# Patient Record
Sex: Male | Born: 1960 | Race: White | Hispanic: No | Marital: Married | Smoking: Never smoker
Health system: Southern US, Community
[De-identification: ages and names within clinical notes are randomized; demographics above are authoritative.]

## PROBLEM LIST (undated history)

## (undated) DIAGNOSIS — M109 Gout, unspecified: Secondary | ICD-10-CM

## (undated) DIAGNOSIS — M1990 Unspecified osteoarthritis, unspecified site: Secondary | ICD-10-CM

## (undated) HISTORY — PX: NO PAST SURGERIES: SHX2092

## (undated) HISTORY — DX: Unspecified osteoarthritis, unspecified site: M19.90

---

## 2010-06-25 DEATH — deceased

## 2016-09-09 ENCOUNTER — Ambulatory Visit
Admission: EM | Admit: 2016-09-09 | Discharge: 2016-09-09 | Disposition: A | Discharge status: Home / Self Care | Payer: BLUE CROSS/BLUE SHIELD | Attending: Family Medicine | Admitting: Family Medicine

## 2016-09-09 ENCOUNTER — Ambulatory Visit (INDEPENDENT_AMBULATORY_CARE_PROVIDER_SITE_OTHER): Payer: BLUE CROSS/BLUE SHIELD

## 2016-09-09 DIAGNOSIS — S39012A Strain of muscle, fascia and tendon of lower back, initial encounter: Secondary | ICD-10-CM

## 2016-09-09 DIAGNOSIS — M5136 Other intervertebral disc degeneration, lumbar region: Secondary | ICD-10-CM

## 2016-09-09 HISTORY — DX: Gout, unspecified: M10.9

## 2016-09-09 LAB — URINALYSIS, COMPLETE (UACMP) WITH MICROSCOPIC
BACTERIA UA: NONE SEEN
Bilirubin Urine: NEGATIVE
GLUCOSE, UA: NEGATIVE mg/dL
Hgb urine dipstick: NEGATIVE
Ketones, ur: NEGATIVE mg/dL
Leukocytes, UA: NEGATIVE
Nitrite: NEGATIVE
PROTEIN: NEGATIVE mg/dL
Squamous Epithelial / LPF: NONE SEEN
pH: 6 (ref 5.0–8.0)

## 2016-09-09 MED ORDER — KETOROLAC TROMETHAMINE 60 MG/2ML IM SOLN
60.0000 mg | Freq: Once | INTRAMUSCULAR | Status: AC
  Administered 2016-09-09: 60 mg via INTRAMUSCULAR

## 2016-09-09 MED ORDER — METAXALONE 800 MG PO TABS: 800.0000 mg | ORAL_TABLET | Freq: Three times a day (TID) | ORAL | 0 refills | Status: DC

## 2016-09-09 MED ORDER — NAPROXEN 500 MG PO TABS: 500.0000 mg | ORAL_TABLET | Freq: Two times a day (BID) | ORAL | 0 refills | Status: DC

## 2016-09-09 NOTE — ED Provider Notes (Signed)
CSN: [id number]     Arrival date & time 09/09/16  1123 History   First MD Initiated Contact with Patient 09/09/16 1227     Chief Complaint  Patient presents with  . Back Pain   (Consider location/radiation/quality/duration/timing/severity/associated sxs/prior Treatment) HPI  This 56 year old male who presents with right lower back pain that is nonradiating. 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 He states his pain is worse at nighttime. He also states that he has had  many back injuries in the past and has seen a chiropractor on numerous occasions but this feels different to him. No Radiation of pain into his lower extremities.     Past Medical History:  Diagnosis Date  . Gout    Past Surgical History:  Procedure Laterality Date  . NO PAST SURGERIES     Family History  Problem Relation Age of Onset  . Cancer Mother   . Cancer Father    Social History  Substance Use Topics  . Smoking status: Never Smoker  . Smokeless tobacco: Never Used  . Alcohol use No    Review of Systems  Allergies  Patient has no known allergies.  Home Medications   Prior to Admission medications   Medication Sig Start Date End Date Taking? Authorizing Provider  indomethacin (INDOCIN) 50 MG capsule Take 50 mg by mouth 2 (two) times daily with a meal.   Yes Historical Provider, MD  meloxicam (MOBIC) 15 MG tablet Take 15 mg by mouth daily.   Yes Historical Provider, MD  metaxalone (SKELAXIN) 800 MG tablet Take 1 tablet (800 mg total) by mouth 3 (three) times daily. 09/09/16   Lutricia FeilWilliam P Branae Crail, PA-C  naproxen (NAPROSYN) 500 MG tablet Take 1 tablet (500 mg total) by mouth 2 (two) times daily with a meal. 09/09/16   Lutricia FeilWilliam P Keyshia Orwick, PA-C   Meds Ordered and Administered this Visit   Medications  ketorolac (TORADOL) injection 60 mg (60 mg Intramuscular Given 09/09/16 1340)     BP 125/85 (BP Location: Left Arm)   Pulse 70   Temp 98.2 F (36.8 C) (Oral)   Resp 18   Ht 6' (1.829 m)   Wt 250 lb (113.4 kg)   SpO2 98%   BMI 33.91 kg/m  No data found.   Physical Exam  Constitutional: He is oriented to person, place, and time. He appears well-developed and well-nourished. No distress.  HENT:  Head: Normocephalic and atraumatic.  Eyes: Pupils are equal, round, and reactive to light. Right eye exhibits no discharge. Left eye exhibits no discharge.  Neck: Normal range of motion. Neck supple.  Musculoskeletal: He exhibits tenderness.  Examination of the lumbar spine is a level pelvis in stance. Patient is able to forward flex with his fingers to the level of his ankles. Turning to upright posture does not cause him pain. Lateral flexion to the right shows a normal curve to the lumbar spine. Lateral flexion to the left shows blunting of the spine with Complaints of discomfort at the L3-L5 levels in the paraspinous muscles. Rotation does not cause any discomfort. Patient is able to toe and heel walk adequately. EHL peroneal and anterior tibialis muscles are strong to clinical testing. And sensation distally is intact to light touch throughout. L our testing in the seated position at 90 is negative bilaterally. DTRs are 1-2+ over 4 bilaterally symmetrical. There is  no clonus present.  Neurological: He is alert and oriented to person, place, and time. He displays normal reflexes. No sensory deficit. He exhibits normal muscle tone.  Skin: Skin is warm and dry. He is not diaphoretic.  Psychiatric: He has a normal mood and affect. His behavior is normal. Judgment and thought content normal.  Nursing note and vitals reviewed.   Urgent Care Course     Procedures (including critical care time)  Labs Review Labs Reviewed  URINALYSIS, COMPLETE (UACMP) WITH MICROSCOPIC - Abnormal; Notable for the following:       Result Value   Specific Gravity, Urine <1.005 (*)    All  other components within normal limits    Imaging Review Dg Lumbar Spine Complete  Result Date: 09/09/2016 CLINICAL DATA:  56 year old male with history of right-sided lower back pain for the past 3 days. No history of injury. EXAM: LUMBAR SPINE - COMPLETE 4+ VIEW COMPARISON:  No priors. FINDINGS: No acute displaced fracture or compression type fracture identified in the lumbar spine. Multilevel degenerative disc disease, most evident at T12-L1, L4-L5 and L5-S1. Multilevel facet arthropathy, most severe at L4-L5 and L5-S1. No defects of the pars interarticularis are noted. Alignment is anatomic. IMPRESSION: 1. No acute radiographic abnormality of the lumbar spine. 2. Multilevel degenerative disc disease and lumbar spondylosis, as above. Electronically Signed   By: Trudie Reed M.D.   On: 09/09/2016 13:33     Visual Acuity Review  Right Eye Distance:   Left Eye Distance:   Bilateral Distance:    Right Eye Near:   Left Eye Near:    Bilateral Near:     Medications  ketorolac (TORADOL) injection 60 mg (60 mg Intramuscular Given 09/09/16 1340)      MDM   1. Strain of lumbar region, initial encounter   2. DDD (degenerative disc disease), lumbar    New Prescriptions   METAXALONE (SKELAXIN) 800 MG TABLET    Take 1 tablet (800 mg total) by mouth 3 (three) times daily.   NAPROXEN (NAPROSYN) 500 MG TABLET    Take 1 tablet (500 mg total) by mouth 2 (two) times daily with a meal.  Plan: 1. Test/x-ray results and diagnosis reviewed with patient 2. rx as per orders; risks, benefits, potential side effects reviewed with patient 3. Recommend supportive treatment with Rest and symptom avoidance. Avoid sitting lifting and bending. I've cautioned him regarding using the muscle relaxers with activities requiring concentration are judgment and not to drive for taking the medication. He may follow-up with his chiropractor as necessary 4. F/u prn if symptoms worsen or don't improve     Lutricia Feil, PA-C 09/09/16 1417

## 2016-09-09 NOTE — ED Triage Notes (Signed)
Patient complains of right lower back pain. Patient states that he has noticed darker urine and has been taking cranberry. Patient reports that pain is worse at night.

## 2017-06-07 ENCOUNTER — Ambulatory Visit
Admission: RE | Admit: 2017-06-07 | Discharge: 2017-06-07 | Disposition: A | Discharge status: Home / Self Care | Payer: BLUE CROSS/BLUE SHIELD | Source: Ambulatory Visit | Attending: Internal Medicine | Admitting: Internal Medicine

## 2017-06-07 DIAGNOSIS — Z01810 Encounter for preprocedural cardiovascular examination: Secondary | ICD-10-CM | POA: Diagnosis not present

## 2017-06-13 ENCOUNTER — Encounter: Payer: Self-pay | Admitting: Internal Medicine

## 2017-06-13 ENCOUNTER — Ambulatory Visit: Payer: BLUE CROSS/BLUE SHIELD | Admitting: Internal Medicine

## 2017-06-13 VITALS — BP 138/98 | HR 64 | Ht 72.0 in | Wt 246.2 lb

## 2017-06-13 DIAGNOSIS — Z01810 Encounter for preprocedural cardiovascular examination: Secondary | ICD-10-CM | POA: Diagnosis not present

## 2017-06-13 DIAGNOSIS — R030 Elevated blood-pressure reading, without diagnosis of hypertension: Secondary | ICD-10-CM

## 2017-06-13 NOTE — Patient Instructions (Signed)
Medication Instructions:  Your physician recommends that you continue on your current medications as directed. Please refer to the Current Medication list given to you today.   Labwork: none  Testing/Procedures: none  Follow-Up: Your physician recommends that you schedule a follow-up appointment in: AS NEEDED.  

## 2017-06-13 NOTE — Progress Notes (Signed)
New Outpatient Visit Date: 06/13/2017  Referring Provider: Jaclyn Newman, Cody C, MD [address]   [place], [place] [address]  Chief Complaint: Preoperative cardiovascular evaluation  HPI:  [name] is a 56 y.o. male who is being seen today for the evaluation of preoperative cardiovascular risk at the request of CodyTate. He has a history of bilateral knee osteoarthritis with significant pain that is affecting his ability to ambulate (right greater than left).  He is being evaluated for a total knee replacement by Dr. Martha Newman next month. He reports progressive pain in the right knee, which is now present with minimal to no activity. He had transient improvement with a knee injection earlier this year, though the relief lasted only about 3 months.  [name] has otherwise felt well. 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 He often has to climb quite a bit as part of his job, and does not have any symptoms including chest pain and shortness of breath. He also denies palpitations, lightheadedness, orthopnea, PND, and edema. He he denies a history of prior cardiac diease and testing. He has never had surgery before.  [name] notes intermittent gout flairs, for which he uses a variety of NSAIDs (he also uses them for his arthritis). --------------------------------------------------------------------------------------------------  Cardiovascular History & Procedures: Cardiovascular Problems:  None  Risk Factors:  Male gender, obesity, and age >= 7255  Cath/PCI:  None  CV Surgery:  None  EP Procedures and Devices:  None  Non-Invasive Evaluation(s):  None  --------------------------------------------------------------------------------------------------  Past Medical History:  Diagnosis Date  . Gout   . Osteoarthritis     Past Surgical History:  Procedure Laterality Date  . NO PAST SURGERIES      Current Meds    Medication Sig  . indomethacin (INDOCIN) 50 MG capsule Take 50 mg by mouth 2 (two) times daily with a meal.  . meloxicam (MOBIC) 15 MG tablet Take 15 mg by mouth daily.  . traMADol (ULTRAM) 50 MG tablet Take 50 mg by mouth as needed.     Allergies: Patient has no known allergies.  Social History   Socioeconomic History  . Marital status: Married    Spouse name: Not on file  . Number of children: Not on file  . Years of education: Not on file  . Highest education level: Not on file  Social Needs  . Financial resource strain: Not on file  . Food insecurity - worry: Not on file  . Food insecurity - inability: Not on file  . Transportation needs - medical: Not on file  . Transportation needs - non-medical: Not on file  Occupational History  . Not on file  Tobacco Use  . Smoking status: Never Smoker  . Smokeless tobacco: Never Used  Substance and Sexual Activity  . Alcohol use: Yes    Comment: Rare EtoH; tries to avoid due to gout.  . Drug use: No  . Sexual activity: Not on file  Other Topics Concern  . Not on file  Social History Narrative  . Not on file    Family History  Problem Relation Age of Onset  . Cancer Mother        Breast  . Cancer Father   . Alcohol abuse Father   . Heart disease Neg Hx     Review of Systems: A 12-system review of systems was performed and was negative except as noted in the HPI.  --------------------------------------------------------------------------------------------------  Physical Exam:  BP (!) 138/98 (BP Location: Right Arm, Patient Position: Sitting, Cuff Size: Normal)   Pulse 64   Ht 6' (1.829 m)   Wt 246 lb 4 oz (111.7 kg)   BMI 33.40 kg/m   General:  Obese man, seated comfortably in the exam room. HEENT: No conjunctival pallor or scleral icterus. Moist mucous membranes. OP clear. Neck: Supple without lymphadenopathy, thyromegaly, JVD, or HJR. No carotid bruit. Lungs: Normal work of breathing. Clear to auscultation  bilaterally without wheezes or crackles. Heart: Regular rate and rhythm without murmurs, rubs, or gallops. Non-displaced PMI. Abd: Bowel sounds present. Soft, NT/ND. Unable to assess HSM due to body habitus. Ext: No lower extremity edema. Radial, PT, and DP pulses are 2+ bilaterally Skin: Warm and dry without rash. Neuro: CNIII-XII intact. Strength and fine-touch sensation intact in upper and lower extremities bilaterally. Psych: Normal mood and affect.  EKG:  NSR without abnormalities.  I personally observed the patient climb 2 flights of steps without angina or shortness of breath.  No results found for: WBC, HGB, HCT, MCV, PLT  No results found for: NA, K, CL, CO2, BUN, CREATININE, GLUCOSE, ALT  No results found for: CHOL, HDL, LDLCALC, LDLDIRECT, TRIG, CHOLHDL   --------------------------------------------------------------------------------------------------  ASSESSMENT AND PLAN: Preoperative cardiovascular risk assessment [name] is planning to undergo right total knee arthrLoleta Chanceoplasty next month. He does not have a history of cardiac disease nor does he endorse any symptoms of unstable cardiovascular disease. His EKG today is normal. [name] is able to perform at least 4 METS of activity without symptoms. He is low risk for perioperative cardiovascular complications and can proceed as scheduled from a cardiac standpoint. No further cardiac testing is warranted prior to surgery.   Elevated blood pressure Blood pressure is mildly elevated, which may be influenced by pain (arthritis and ongoing gout flair) as well as NSAID use. I encouraged [name] to limit his salt intake and follow-up with Dr. Arlana Newman to ensure that his blood pressure is not persistently elevated.  Follow-up: Return to clinic as needed.  Cody Kendallhristopher Jahzier Villalon, MD 06/14/2017 11:34 AM

## 2017-06-14 ENCOUNTER — Encounter: Payer: Self-pay | Admitting: Internal Medicine

## 2017-06-14 DIAGNOSIS — Z01810 Encounter for preprocedural cardiovascular examination: Secondary | ICD-10-CM | POA: Insufficient documentation

## 2017-06-14 DIAGNOSIS — R030 Elevated blood-pressure reading, without diagnosis of hypertension: Secondary | ICD-10-CM | POA: Insufficient documentation

## 2017-06-28 ENCOUNTER — Other Ambulatory Visit: Payer: Self-pay | Admitting: Orthopedic Surgery

## 2017-07-03 ENCOUNTER — Ambulatory Visit
Admission: RE | Admit: 2017-07-03 | Discharge: 2017-07-03 | Disposition: A | Discharge status: Home / Self Care | Payer: BLUE CROSS/BLUE SHIELD | Source: Ambulatory Visit | Attending: Orthopedic Surgery | Admitting: Orthopedic Surgery

## 2017-07-03 ENCOUNTER — Encounter
Admission: RE | Admit: 2017-07-03 | Discharge: 2017-07-03 | Disposition: A | Discharge status: Home / Self Care | Payer: BLUE CROSS/BLUE SHIELD | Source: Ambulatory Visit | Attending: Orthopedic Surgery | Admitting: Orthopedic Surgery

## 2017-07-03 ENCOUNTER — Other Ambulatory Visit: Payer: Self-pay

## 2017-07-03 DIAGNOSIS — Z01818 Encounter for other preprocedural examination: Secondary | ICD-10-CM | POA: Insufficient documentation

## 2017-07-03 DIAGNOSIS — Z01811 Encounter for preprocedural respiratory examination: Secondary | ICD-10-CM | POA: Insufficient documentation

## 2017-07-03 LAB — CBC WITH DIFFERENTIAL/PLATELET
BASOS ABS: 0.1 10*3/uL (ref 0–0.1)
BASOS PCT: 1 %
Eosinophils Absolute: 0.1 10*3/uL (ref 0–0.7)
Eosinophils Relative: 1 %
HEMATOCRIT: 43.4 % (ref 40.0–52.0)
HEMOGLOBIN: 14.6 g/dL (ref 13.0–18.0)
Lymphocytes Relative: 28 %
Lymphs Abs: 1.7 10*3/uL (ref 1.0–3.6)
MCH: 30.3 pg (ref 26.0–34.0)
MCHC: 33.7 g/dL (ref 32.0–36.0)
MCV: 90 fL (ref 80.0–100.0)
MONO ABS: 0.7 10*3/uL (ref 0.2–1.0)
Monocytes Relative: 11 %
NEUTROS ABS: 3.6 10*3/uL (ref 1.4–6.5)
NEUTROS PCT: 59 %
Platelets: 287 10*3/uL (ref 150–440)
RBC: 4.82 MIL/uL (ref 4.40–5.90)
RDW: 13.2 % (ref 11.5–14.5)
WBC: 6.1 10*3/uL (ref 3.8–10.6)

## 2017-07-03 LAB — URINALYSIS, ROUTINE W REFLEX MICROSCOPIC
BILIRUBIN URINE: NEGATIVE
Glucose, UA: NEGATIVE mg/dL
Hgb urine dipstick: NEGATIVE
KETONES UR: NEGATIVE mg/dL
Leukocytes, UA: NEGATIVE
NITRITE: NEGATIVE
PROTEIN: NEGATIVE mg/dL
SPECIFIC GRAVITY, URINE: 1.006 (ref 1.005–1.030)
pH: 6 (ref 5.0–8.0)

## 2017-07-03 LAB — TYPE AND SCREEN
ABO/RH(D): O POS
Antibody Screen: NEGATIVE

## 2017-07-03 LAB — COMPREHENSIVE METABOLIC PANEL
ALBUMIN: 3.9 g/dL (ref 3.5–5.0)
ALT: 24 U/L (ref 17–63)
AST: 22 U/L (ref 15–41)
Alkaline Phosphatase: 67 U/L (ref 38–126)
Anion gap: 7 (ref 5–15)
BILIRUBIN TOTAL: 1.3 mg/dL — AB (ref 0.3–1.2)
BUN: 12 mg/dL (ref 6–20)
CO2: 27 mmol/L (ref 22–32)
Calcium: 9.2 mg/dL (ref 8.9–10.3)
Chloride: 102 mmol/L (ref 101–111)
Creatinine, Ser: 0.68 mg/dL (ref 0.61–1.24)
GFR calc Af Amer: >60 mL/min (ref >60)
GFR calc non Af Amer: >60 mL/min (ref >60)
GLUCOSE: 88 mg/dL (ref 65–99)
Potassium: 3.8 mmol/L (ref 3.5–5.1)
Sodium: 136 mmol/L (ref 135–145)
TOTAL PROTEIN: 7.5 g/dL (ref 6.5–8.1)

## 2017-07-03 LAB — APTT: APTT: 29 s (ref 24–36)

## 2017-07-03 LAB — HEMOGLOBIN A1C
Hgb A1c MFr Bld: 5.3 % (ref 4.8–5.6)
Mean Plasma Glucose: 105.41 mg/dL

## 2017-07-03 NOTE — Patient Instructions (Signed)
Your procedure is scheduled on:  Thursday, July 11, 2017 Report to Same Day Surgery on the 2nd floor in the [facility]. To find out your arrival time, please call [phone] between 1PM - 3PM on: Wednesday, July 10, 2017  REMEMBER: Instructions that are not followed completely may result in serious medical risk, up to and including death; or upon the discretion of your surgeon and anesthesiologist your surgery may need to be rescheduled.  Do not eat food after midnight the night before your procedure.  No gum chewing or hard candies.  You may however, drink CLEAR liquids up to 2 hours before you are scheduled to arrive at the hospital for your procedure.  Do not drink clear liquids within 2 hours of the start of your surgery.  Clear liquids include: - water  - apple juice without pulp - clear gatorade - black coffee or tea (Do NOT add anything to the coffee or tea) Do NOT drink anything that is not on this list.  No Alcohol for 24 hours before or after surgery.  No Smoking including e-cigarettes for 24 hours prior to surgery. No chewable tobacco products for at least 6 hours prior to surgery. No nicotine patches on the day of surgery.  Notify your doctor if there is any change in your medical condition (cold, fever, infection).  Do not wear jewelry, make-up, hairpins, clips or nail polish.  Do not wear lotions, powders, or perfumes. You may wear deodorant.  Do not shave 48 hours prior to surgery. Men may shave face and neck.  Contacts and dentures may not be worn into surgery.  Do not bring valuables to the hospital. [facility] is not responsible for any belongings or valuables.  TAKE THESE MEDICATIONS THE MORNING OF SURGERY:  none  Use CHG Soap as directed on instruction sheet.  NOW!  Stop Anti-inflammatories such as INDOMETHACIN, MELOXICAM, Advil, Aleve, Ibuprofen, Motrin, Naproxen, Naprosyn, Goodie powder, or aspirin products. (May take TRAMADOL, Tylenol or  Acetaminophen if needed.)  NOW!  Stop ANY OVER THE COUNTER supplements until after surgery.  If you are being admitted to the hospital overnight, leave your suitcase in the car. After surgery it may be brought to your room.  Please call the number above if you have any questions about these instructions.

## 2017-07-04 LAB — SURGICAL PCR SCREEN
MRSA, PCR: NEGATIVE
Staphylococcus aureus: NEGATIVE

## 2017-07-10 MED ORDER — CLINDAMYCIN PHOSPHATE 600 MG/50ML IV SOLN
600.0000 mg | Freq: Once | INTRAVENOUS | Status: AC
  Administered 2017-07-11: 600 mg via INTRAVENOUS

## 2017-07-10 MED ORDER — CEFAZOLIN SODIUM-DEXTROSE 2-4 GM/100ML-% IV SOLN
2.0000 g | INTRAVENOUS | Status: AC
  Administered 2017-07-11: 2 g via INTRAVENOUS

## 2017-07-11 ENCOUNTER — Encounter
Admission: RE | Disposition: A | Discharge status: Home / Self Care | Payer: Self-pay | Source: Ambulatory Visit | Attending: Orthopedic Surgery

## 2017-07-11 ENCOUNTER — Inpatient Hospital Stay: Payer: BLUE CROSS/BLUE SHIELD | Admitting: Anesthesiology

## 2017-07-11 ENCOUNTER — Other Ambulatory Visit: Payer: Self-pay

## 2017-07-11 ENCOUNTER — Inpatient Hospital Stay: Payer: BLUE CROSS/BLUE SHIELD

## 2017-07-11 ENCOUNTER — Inpatient Hospital Stay
Admission: RE | Admit: 2017-07-11 | Discharge: 2017-07-14 | DRG: 470 | Disposition: A | Discharge status: Home / Self Care | Payer: BLUE CROSS/BLUE SHIELD | Source: Ambulatory Visit | Attending: Orthopedic Surgery | Admitting: Orthopedic Surgery

## 2017-07-11 DIAGNOSIS — Z6833 Body mass index (BMI) 33.0-33.9, adult: Secondary | ICD-10-CM

## 2017-07-11 DIAGNOSIS — M170 Bilateral primary osteoarthritis of knee: Principal | ICD-10-CM | POA: Diagnosis present

## 2017-07-11 DIAGNOSIS — Z791 Long term (current) use of non-steroidal anti-inflammatories (NSAID): Secondary | ICD-10-CM | POA: Diagnosis not present

## 2017-07-11 DIAGNOSIS — E669 Obesity, unspecified: Secondary | ICD-10-CM | POA: Diagnosis present

## 2017-07-11 DIAGNOSIS — M109 Gout, unspecified: Secondary | ICD-10-CM | POA: Diagnosis present

## 2017-07-11 DIAGNOSIS — Z79891 Long term (current) use of opiate analgesic: Secondary | ICD-10-CM

## 2017-07-11 DIAGNOSIS — Z96651 Presence of right artificial knee joint: Secondary | ICD-10-CM

## 2017-07-11 DIAGNOSIS — M25561 Pain in right knee: Secondary | ICD-10-CM | POA: Diagnosis present

## 2017-07-11 HISTORY — PX: TOTAL KNEE ARTHROPLASTY: SHX125

## 2017-07-11 LAB — ABO/RH: ABO/RH(D): O POS

## 2017-07-11 SURGERY — ARTHROPLASTY, KNEE, TOTAL
Anesthesia: Spinal | Laterality: Right

## 2017-07-11 MED ORDER — ONDANSETRON HCL 4 MG/2ML IJ SOLN: 4.0000 mg | Freq: Four times a day (QID) | INTRAMUSCULAR | Status: DC | PRN

## 2017-07-11 MED ORDER — MENTHOL 3 MG MT LOZG
1.0000 | LOZENGE | OROMUCOSAL | Status: DC | PRN
  Filled 2017-07-11: qty 9

## 2017-07-11 MED ORDER — ERYTHROMYCIN 5 MG/GM OP OINT
TOPICAL_OINTMENT | OPHTHALMIC | Status: AC
  Administered 2017-07-11 (×2): 1 via OPHTHALMIC
  Administered 2017-07-11: 17:00:00 via OPHTHALMIC
  Administered 2017-07-12 (×3): 1 via OPHTHALMIC
  Filled 2017-07-11: qty 3.5

## 2017-07-11 MED ORDER — SODIUM CHLORIDE 0.9 % IJ SOLN
INTRAMUSCULAR | Status: AC
Start: 2017-07-11 — End: 2017-07-11
  Filled 2017-07-11: qty 50

## 2017-07-11 MED ORDER — FAMOTIDINE 20 MG PO TABS
ORAL_TABLET | ORAL | Status: AC
  Administered 2017-07-11: 20 mg via ORAL
  Filled 2017-07-11: qty 1

## 2017-07-11 MED ORDER — TETRACAINE HCL 1 % IJ SOLN
INTRAMUSCULAR | Status: DC | PRN
  Administered 2017-07-11: 3 mg via INTRASPINAL

## 2017-07-11 MED ORDER — BUPIVACAINE-EPINEPHRINE (PF) 0.25% -1:200000 IJ SOLN
INTRAMUSCULAR | Status: AC
  Filled 2017-07-11: qty 60

## 2017-07-11 MED ORDER — PROPOFOL 500 MG/50ML IV EMUL
INTRAVENOUS | Status: DC | PRN
  Administered 2017-07-11: 125 ug/kg/min via INTRAVENOUS

## 2017-07-11 MED ORDER — METHOCARBAMOL 1000 MG/10ML IJ SOLN
500.0000 mg | Freq: Four times a day (QID) | INTRAMUSCULAR | Status: DC | PRN
  Filled 2017-07-11: qty 5

## 2017-07-11 MED ORDER — TRANEXAMIC ACID 1000 MG/10ML IV SOLN
INTRAVENOUS | Status: DC | PRN
  Administered 2017-07-11: 1000 mg via INTRAVENOUS

## 2017-07-11 MED ORDER — OXYCODONE HCL 5 MG PO TABS
5.0000 mg | ORAL_TABLET | ORAL | Status: DC | PRN
  Administered 2017-07-11: 5 mg via ORAL

## 2017-07-11 MED ORDER — PROPOFOL 500 MG/50ML IV EMUL
INTRAVENOUS | Status: AC
  Filled 2017-07-11: qty 50

## 2017-07-11 MED ORDER — CEFAZOLIN SODIUM-DEXTROSE 2-4 GM/100ML-% IV SOLN
2.0000 g | Freq: Four times a day (QID) | INTRAVENOUS | Status: AC
  Administered 2017-07-11 (×2): 2 g via INTRAVENOUS
  Filled 2017-07-11 (×3): qty 100

## 2017-07-11 MED ORDER — FENTANYL CITRATE (PF) 100 MCG/2ML IJ SOLN
25.0000 ug | INTRAMUSCULAR | Status: DC | PRN
  Administered 2017-07-11 (×4): 25 ug via INTRAVENOUS

## 2017-07-11 MED ORDER — CLINDAMYCIN PHOSPHATE 600 MG/50ML IV SOLN
INTRAVENOUS | Status: AC
  Filled 2017-07-11: qty 50

## 2017-07-11 MED ORDER — BISACODYL 5 MG PO TBEC: 5.0000 mg | DELAYED_RELEASE_TABLET | Freq: Every day | ORAL | Status: DC | PRN

## 2017-07-11 MED ORDER — MAGNESIUM CITRATE PO SOLN
1.0000 | Freq: Once | ORAL | Status: DC | PRN
  Filled 2017-07-11: qty 296

## 2017-07-11 MED ORDER — GLYCOPYRROLATE 0.2 MG/ML IJ SOLN
0.2000 mg | Freq: Once | INTRAMUSCULAR | Status: AC
  Administered 2017-07-11: 0.2 mg via INTRAVENOUS

## 2017-07-11 MED ORDER — CELECOXIB 200 MG PO CAPS
200.0000 mg | ORAL_CAPSULE | Freq: Two times a day (BID) | ORAL | Status: DC
  Administered 2017-07-11 – 2017-07-14 (×6): 200 mg via ORAL
  Filled 2017-07-11 (×6): qty 1

## 2017-07-11 MED ORDER — OXYCODONE HCL 5 MG PO TABS
ORAL_TABLET | ORAL | Status: AC
  Administered 2017-07-11: 5 mg via ORAL
  Filled 2017-07-11: qty 1

## 2017-07-11 MED ORDER — ACETAMINOPHEN 325 MG PO TABS: 650.0000 mg | ORAL_TABLET | ORAL | Status: DC | PRN

## 2017-07-11 MED ORDER — OXYCODONE HCL 5 MG PO TABS
15.0000 mg | ORAL_TABLET | ORAL | Status: DC | PRN
  Administered 2017-07-11 – 2017-07-14 (×11): 15 mg via ORAL
  Filled 2017-07-11 (×11): qty 3

## 2017-07-11 MED ORDER — PROPOFOL 10 MG/ML IV BOLUS
INTRAVENOUS | Status: DC | PRN
  Administered 2017-07-11: 60 mg via INTRAVENOUS

## 2017-07-11 MED ORDER — FAMOTIDINE 20 MG PO TABS
20.0000 mg | ORAL_TABLET | Freq: Once | ORAL | Status: AC
  Administered 2017-07-11: 20 mg via ORAL

## 2017-07-11 MED ORDER — BUPIVACAINE HCL (PF) 0.5 % IJ SOLN
INTRAMUSCULAR | Status: DC | PRN
  Administered 2017-07-11: 3.2 mL

## 2017-07-11 MED ORDER — ONDANSETRON HCL 4 MG/2ML IJ SOLN
4.0000 mg | Freq: Once | INTRAMUSCULAR | Status: AC | PRN
  Administered 2017-07-11: 4 mg via INTRAVENOUS

## 2017-07-11 MED ORDER — DOCUSATE SODIUM 100 MG PO CAPS
100.0000 mg | ORAL_CAPSULE | Freq: Two times a day (BID) | ORAL | Status: DC
  Administered 2017-07-11 – 2017-07-14 (×6): 100 mg via ORAL
  Filled 2017-07-11 (×6): qty 1

## 2017-07-11 MED ORDER — MIDAZOLAM HCL 5 MG/5ML IJ SOLN
INTRAMUSCULAR | Status: DC | PRN
  Administered 2017-07-11: 2 mg via INTRAVENOUS

## 2017-07-11 MED ORDER — KETOROLAC TROMETHAMINE 15 MG/ML IJ SOLN
15.0000 mg | Freq: Four times a day (QID) | INTRAMUSCULAR | Status: AC
  Administered 2017-07-11 – 2017-07-12 (×3): 15 mg via INTRAVENOUS
  Filled 2017-07-11 (×3): qty 1

## 2017-07-11 MED ORDER — HYDROMORPHONE HCL 1 MG/ML IJ SOLN
1.0000 mg | INTRAMUSCULAR | Status: DC | PRN
  Administered 2017-07-12 (×2): 1 mg via INTRAVENOUS
  Filled 2017-07-11 (×2): qty 1

## 2017-07-11 MED ORDER — GLYCOPYRROLATE 0.2 MG/ML IJ SOLN: 0.1000 mg | Freq: Once | INTRAMUSCULAR | Status: DC

## 2017-07-11 MED ORDER — GLYCOPYRROLATE 0.2 MG/ML IJ SOLN
INTRAMUSCULAR | Status: DC | PRN
  Administered 2017-07-11: 0.2 mg via INTRAVENOUS

## 2017-07-11 MED ORDER — MORPHINE SULFATE 4 MG/ML IJ SOLN
INTRAMUSCULAR | Status: DC | PRN
  Administered 2017-07-11: 30 mL via INTRA_ARTICULAR

## 2017-07-11 MED ORDER — METHOCARBAMOL 500 MG PO TABS: 500.0000 mg | ORAL_TABLET | Freq: Four times a day (QID) | ORAL | Status: DC | PRN

## 2017-07-11 MED ORDER — OXYCODONE HCL 5 MG PO TABS
10.0000 mg | ORAL_TABLET | ORAL | Status: DC | PRN
  Administered 2017-07-11: 10 mg via ORAL
  Filled 2017-07-11: qty 2

## 2017-07-11 MED ORDER — ENOXAPARIN SODIUM 30 MG/0.3ML ~~LOC~~ SOLN
30.0000 mg | Freq: Two times a day (BID) | SUBCUTANEOUS | Status: DC
  Administered 2017-07-12 – 2017-07-14 (×5): 30 mg via SUBCUTANEOUS
  Filled 2017-07-11 (×5): qty 0.3

## 2017-07-11 MED ORDER — NEOMYCIN-POLYMYXIN B GU 40-200000 IR SOLN
Status: DC | PRN
  Administered 2017-07-11: 16 mL

## 2017-07-11 MED ORDER — PHENOL 1.4 % MT LIQD
1.0000 | OROMUCOSAL | Status: DC | PRN
Start: 2017-07-11 — End: 2017-07-14
  Filled 2017-07-11: qty 177

## 2017-07-11 MED ORDER — SODIUM CHLORIDE 0.9 % IV SOLN
INTRAVENOUS | Status: DC | PRN
  Administered 2017-07-11: 25 ug/min via INTRAVENOUS

## 2017-07-11 MED ORDER — GLYCOPYRROLATE 0.2 MG/ML IJ SOLN
INTRAMUSCULAR | Status: AC
  Filled 2017-07-11: qty 1

## 2017-07-11 MED ORDER — ACETAMINOPHEN 650 MG RE SUPP: 650.0000 mg | RECTAL | Status: DC | PRN

## 2017-07-11 MED ORDER — SEVOFLURANE IN SOLN
RESPIRATORY_TRACT | Status: AC
  Filled 2017-07-11: qty 250

## 2017-07-11 MED ORDER — ONDANSETRON HCL 4 MG PO TABS: 4.0000 mg | ORAL_TABLET | Freq: Four times a day (QID) | ORAL | Status: DC | PRN

## 2017-07-11 MED ORDER — MIDAZOLAM HCL 2 MG/2ML IJ SOLN
INTRAMUSCULAR | Status: AC
  Filled 2017-07-11: qty 2

## 2017-07-11 MED ORDER — CHLORHEXIDINE GLUCONATE CLOTH 2 % EX PADS: 6.0000 | MEDICATED_PAD | Freq: Once | CUTANEOUS | Status: DC

## 2017-07-11 MED ORDER — MORPHINE SULFATE (PF) 4 MG/ML IV SOLN
INTRAVENOUS | Status: AC
  Filled 2017-07-11: qty 1

## 2017-07-11 MED ORDER — BUPIVACAINE LIPOSOME 1.3 % IJ SUSP
INTRAMUSCULAR | Status: AC
  Filled 2017-07-11: qty 20

## 2017-07-11 MED ORDER — ALUM & MAG HYDROXIDE-SIMETH 200-200-20 MG/5ML PO SUSP: 30.0000 mL | ORAL | Status: DC | PRN

## 2017-07-11 MED ORDER — DIPHENHYDRAMINE HCL 12.5 MG/5ML PO ELIX: 12.5000 mg | ORAL_SOLUTION | ORAL | Status: DC | PRN

## 2017-07-11 MED ORDER — SODIUM CHLORIDE 0.9 % IV SOLN
INTRAVENOUS | Status: DC | PRN
  Administered 2017-07-11: 60 mL

## 2017-07-11 MED ORDER — LACTATED RINGERS IV SOLN
INTRAVENOUS | Status: DC
  Administered 2017-07-11: 1000 mL via INTRAVENOUS
  Administered 2017-07-11 (×2): via INTRAVENOUS

## 2017-07-11 MED ORDER — POLYETHYLENE GLYCOL 3350 17 G PO PACK
17.0000 g | PACK | Freq: Every day | ORAL | Status: DC | PRN
  Administered 2017-07-13: 17 g via ORAL
  Filled 2017-07-11: qty 1

## 2017-07-11 MED ORDER — TRANEXAMIC ACID 1000 MG/10ML IV SOLN
INTRAVENOUS | Status: AC
  Filled 2017-07-11: qty 10

## 2017-07-11 MED ORDER — SODIUM CHLORIDE 0.9 % IV SOLN
INTRAVENOUS | Status: DC
  Administered 2017-07-11 – 2017-07-12 (×3): via INTRAVENOUS

## 2017-07-11 MED ORDER — NEOMYCIN-POLYMYXIN B GU 40-200000 IR SOLN
Status: AC
  Filled 2017-07-11: qty 20

## 2017-07-11 MED ORDER — GLYCOPYRROLATE 0.2 MG/ML IJ SOLN
INTRAMUSCULAR | Status: AC
  Administered 2017-07-11: 0.2 mg via INTRAVENOUS
  Filled 2017-07-11: qty 1

## 2017-07-11 MED ORDER — OXYCODONE HCL 5 MG PO TABS: 5.0000 mg | ORAL_TABLET | ORAL | Status: DC | PRN

## 2017-07-11 MED ORDER — ZOLPIDEM TARTRATE 5 MG PO TABS: 5.0000 mg | ORAL_TABLET | Freq: Every evening | ORAL | Status: DC | PRN

## 2017-07-11 MED ORDER — FENTANYL CITRATE (PF) 100 MCG/2ML IJ SOLN
INTRAMUSCULAR | Status: AC
  Administered 2017-07-11: 25 ug via INTRAVENOUS
  Filled 2017-07-11: qty 2

## 2017-07-11 MED ORDER — GABAPENTIN 300 MG PO CAPS
300.0000 mg | ORAL_CAPSULE | Freq: Three times a day (TID) | ORAL | Status: DC
  Administered 2017-07-11 – 2017-07-14 (×9): 300 mg via ORAL
  Filled 2017-07-11 (×9): qty 1

## 2017-07-11 SURGICAL SUPPLY — 65 items
AUTOTRANSFUS HAS 1/8 (MISCELLANEOUS)
BAG DECANTER FOR FLEXI CONT (MISCELLANEOUS) ×3 IMPLANT
BLADE SAW 1 (BLADE) ×3 IMPLANT
BLADE SAW 1/2 (BLADE) ×3 IMPLANT
CANISTER SUCT 1200ML W/VALVE (MISCELLANEOUS) ×3 IMPLANT
CANISTER SUCT 3000ML PPV (MISCELLANEOUS) ×6 IMPLANT
CAP KNEE TOTAL 3 SIGMA ×3 IMPLANT
CEMENT HV SMART SET (Cement) ×6 IMPLANT
CNTNR SPEC 2.5X3XGRAD LEK (MISCELLANEOUS) ×1
CONT SPEC 4OZ STER OR WHT (MISCELLANEOUS) ×2
CONTAINER SPEC 2.5X3XGRAD LEK (MISCELLANEOUS) ×1 IMPLANT
COOLER POLAR GLACIER W/PUMP (MISCELLANEOUS) ×3 IMPLANT
CUFF TOURN 24 STER (MISCELLANEOUS) IMPLANT
CUFF TOURN 30 STER DUAL PORT (MISCELLANEOUS) ×3 IMPLANT
DRAPE IMP U-DRAPE 54X76 (DRAPES) ×3 IMPLANT
DRAPE INCISE IOBAN 66X60 STRL (DRAPES) ×3 IMPLANT
DRAPE SHEET LG 3/4 BI-LAMINATE (DRAPES) ×6 IMPLANT
DRAPE SURG 17X11 SM STRL (DRAPES) ×6 IMPLANT
DRSG OPSITE POSTOP 4X12 (GAUZE/BANDAGES/DRESSINGS) IMPLANT
DRSG OPSITE POSTOP 4X14 (GAUZE/BANDAGES/DRESSINGS) ×3 IMPLANT
DURAPREP 26ML APPLICATOR (WOUND CARE) ×12 IMPLANT
ELECT REM PT RETURN 9FT ADLT (ELECTROSURGICAL) ×3
ELECTRODE REM PT RTRN 9FT ADLT (ELECTROSURGICAL) ×1 IMPLANT
GAUZE SPONGE 4X4 12PLY STRL (GAUZE/BANDAGES/DRESSINGS) ×3 IMPLANT
GLOVE BIOGEL PI IND STRL 9 (GLOVE) ×1 IMPLANT
GLOVE BIOGEL PI INDICATOR 9 (GLOVE) ×2
GLOVE SURG 9.0 ORTHO LTXF (GLOVE) ×6 IMPLANT
GOWN STRL REUS TWL 2XL XL LVL4 (GOWN DISPOSABLE) ×3 IMPLANT
GOWN STRL REUS W/ TWL LRG LVL3 (GOWN DISPOSABLE) ×1 IMPLANT
GOWN STRL REUS W/ TWL LRG LVL4 (GOWN DISPOSABLE) ×1 IMPLANT
GOWN STRL REUS W/TWL LRG LVL3 (GOWN DISPOSABLE) ×2
GOWN STRL REUS W/TWL LRG LVL4 (GOWN DISPOSABLE) ×2
HOLDER FOLEY CATH W/STRAP (MISCELLANEOUS) ×3 IMPLANT
IMMBOLIZER KNEE 19 BLUE UNIV (SOFTGOODS) IMPLANT
IMMOB KNEE 24 THIGH 24 443303 (SOFTGOODS) ×3 IMPLANT
IV NS 100ML SINGLE PACK (IV SOLUTION) ×3 IMPLANT
KIT RM TURNOVER STRD PROC AR (KITS) ×3 IMPLANT
NDL SAFETY ECLIPSE 18X1.5 (NEEDLE) ×1 IMPLANT
NEEDLE HYPO 18GX1.5 SHARP (NEEDLE) ×2
NEEDLE HYPO 22GX1.5 SAFETY (NEEDLE) ×3 IMPLANT
NEEDLE SPNL 18GX3.5 QUINCKE PK (NEEDLE) ×3 IMPLANT
NEEDLE SPNL 20GX3.5 QUINCKE YW (NEEDLE) ×3 IMPLANT
NS IRRIG 1000ML POUR BTL (IV SOLUTION) ×3 IMPLANT
PACK TOTAL KNEE (MISCELLANEOUS) ×3 IMPLANT
PAD PREP 24X41 OB/GYN DISP (PERSONAL CARE ITEMS) ×3 IMPLANT
PAD WRAPON POLAR KNEE (MISCELLANEOUS) ×1 IMPLANT
PULSAVAC PLUS IRRIG FAN TIP (DISPOSABLE) ×3
SOL .9 NS 3000ML IRR  AL (IV SOLUTION) ×2
SOL .9 NS 3000ML IRR UROMATIC (IV SOLUTION) ×1 IMPLANT
SPONGE DRAIN TRACH 4X4 STRL 2S (GAUZE/BANDAGES/DRESSINGS) IMPLANT
SPONGE LAP 18X18 5 PK (GAUZE/BANDAGES/DRESSINGS) IMPLANT
STAPLER SKIN PROX 35W (STAPLE) ×3 IMPLANT
SUCTION FRAZIER HANDLE 10FR (MISCELLANEOUS) ×2
SUCTION TUBE FRAZIER 10FR DISP (MISCELLANEOUS) ×1 IMPLANT
SUT ETHIBOND NAB CT1 #1 30IN (SUTURE) ×6 IMPLANT
SUT VIC AB 0 CT1 36 (SUTURE) ×3 IMPLANT
SUT VIC AB 2-0 CT1 (SUTURE) ×6 IMPLANT
SYR 20CC LL (SYRINGE) ×3 IMPLANT
SYR 30ML LL (SYRINGE) ×6 IMPLANT
SYSTEM AUTOTRANSFUS DUAL TROCR (MISCELLANEOUS) IMPLANT
TIP FAN IRRIG PULSAVAC PLUS (DISPOSABLE) ×1 IMPLANT
TOWER CARTRIDGE SMART MIX (DISPOSABLE) ×3 IMPLANT
TRAY FOLEY W/METER SILVER 16FR (SET/KITS/TRAYS/PACK) ×3 IMPLANT
TUBE SUCT KAM VAC (TUBING) ×3 IMPLANT
WRAPON POLAR PAD KNEE (MISCELLANEOUS) ×3

## 2017-07-11 NOTE — Transfer of Care (Signed)
Immediate Anesthesia Transfer of Care Note  Patient: Cody Newman  Procedure(s) Performed: TOTAL KNEE ARTHROPLASTY (Right )  Patient Location: PACU  Anesthesia Type:Spinal  Level of Consciousness: sedated  Airway & Oxygen Therapy: Patient Spontanous Breathing and Patient connected to face mask oxygen  Post-op Assessment: Report given to RN and Post -op Vital signs reviewed and stable  Post vital signs: Reviewed and stable  Last Vitals:  Vitals:   07/11/17 0611  BP: (!) 152/105  Pulse: 69  Resp: 18  Temp: (!) 36.2 C  SpO2: 97%    Last Pain:  Vitals:   07/11/17 0611  TempSrc: Tympanic  PainSc: 5          Complications: No apparent anesthesia complications

## 2017-07-11 NOTE — Anesthesia Preprocedure Evaluation (Signed)
Anesthesia Evaluation  Patient identified by MRN, date of birth, ID band Patient awake    Reviewed: Allergy & Precautions, NPO status , Patient's Chart, lab work & pertinent test results  Airway Mallampati: II  TM Distance: >3 FB     Dental  (+) Teeth Intact   Pulmonary neg pulmonary ROS,    Pulmonary exam normal        Cardiovascular negative cardio ROS Normal cardiovascular exam     Neuro/Psych negative neurological ROS  negative psych ROS   GI/Hepatic negative GI ROS, Neg liver ROS,   Endo/Other  negative endocrine ROS  Renal/GU negative Renal ROS  negative genitourinary   Musculoskeletal  (+) Arthritis , Osteoarthritis,    Abdominal (+) + obese,   Peds negative pediatric ROS (+)  Hematology negative hematology ROS (+)   Anesthesia Other Findings   Reproductive/Obstetrics                             Anesthesia Physical Anesthesia Plan  ASA: II  Anesthesia Plan: Spinal   Post-op Pain Management:    Induction: Intravenous  PONV Risk Score and Plan:   Airway Management Planned: Nasal Cannula  Additional Equipment:   Intra-op Plan:   Post-operative Plan:   Informed Consent: I have reviewed the patients History and Physical, chart, labs and discussed the procedure including the risks, benefits and alternatives for the proposed anesthesia with the patient or authorized representative who has indicated his/her understanding and acceptance.   Dental advisory given  Plan Discussed with: CRNA and Surgeon  Anesthesia Plan Comments:         Anesthesia Quick Evaluation

## 2017-07-11 NOTE — Progress Notes (Signed)
  Subjective:  POST-OP CHECK:  S/p right TKA.  Patient reports right knee pain as moderate.  Family at the bedside.  Denies chest pain or shortness of breath.  Patient denies nausea or vomiting. He has tolerated Jell-O and clear liquids.  Objective:   VITALS:   Vitals:   07/11/17 1624 07/11/17 1753 07/11/17 1822 07/11/17 1841  BP: (!) 145/87 131/90 (!) 169/85 (!) 152/94  Pulse: 78 76 82 73  Resp: 16 16    Temp: 98.2 F (36.8 C) 97.8 F (36.6 C) 98.4 F (36.9 C)   TempSrc:  Oral Oral   SpO2: 100% 97% 96%   Weight:      Height:        PHYSICAL EXAM: Right lower extremity: Neurovascular intact Sensation intact distally Intact pulses distally Dorsiflexion/Plantar flexion intact Compartment soft  LABS  Results for orders placed or performed during the hospital encounter of 07/11/17 (from the past 24 hour(s))  ABO/Rh     Status: None   Collection Time: 07/11/17  6:45 AM  Result Value Ref Range   ABO/RH(D)      O POS Performed at [facility], [address] [place], [place] [address]     Dg Knee Right Port  Result Date: 07/11/2017 CLINICAL DATA:  Status post right total knee replacement. EXAM: PORTABLE RIGHT KNEE - 1-2 VIEW COMPARISON:  None. FINDINGS: The femoral and tibial components appear to be well situated. Expected postoperative changes are noted in the soft tissues anteriorly. No fracture or dislocation is noted. IMPRESSION: Status post right total knee arthroplasty. Electronically Signed   By: Lupita RaiderJames  Green Jr, M.D.   On: 07/11/2017 12:06    Assessment/Plan: Day of Surgery   Active Problems:   S/P TKR (total knee replacement) using cement, right  Patient stable postop. Patient will receive 24 hours postop antibiotics. I have increased his oxycodone to 10-15 mg every 3 hours as needed. I also prescribed Toradol, Celebrex and Neurontin for pain.  Patient has Dilaudid IV for breakthrough pain. Patient was fully catheter removed in the morning. Labs will  be rechecked in the morning. Patient will begin physical and occupational therapy tomorrow. He is weightbearing as tolerated on the right lower extremity. Patient will continue his Polar Care and elevation overnight. I reviewed the postoperative x-ray. The patient's components are well positioned.  There is no evidence of fracture, dislocation or loosening.   Juanell FairlyKRASINSKI, Emryn Flanery , MD 07/11/2017, 7:10 PM

## 2017-07-11 NOTE — [place] FL2 (Signed)
[place] MEDICAID FL2 LEVEL OF CARE SCREENING TOOL     IDENTIFICATION  Patient Name: Cody Newman Birthdate: 07-19-1960 Sex: male Admission Date (Current Location): 07/11/2017  [facility] and [place] Number:  Chiropodist and Address:  [facility], [address] [place], [place] [address]      Provider Number: [id number]  Attending Physician Name and Address:  Juanell Fairly, MD  Relative Name and Phone Number:       Current Level of Care: Hospital Recommended Level of Care: Skilled Nursing Facility Prior Approval Number:    Date Approved/Denied:   PASRR Number:    Discharge Plan: SNF    Current Diagnoses: Patient Active Problem List   Diagnosis Date Noted  . S/P TKR (total knee replacement) using cement, right 07/11/2017  . Preprocedural cardiovascular examination 06/14/2017  . Elevated blood pressure reading without diagnosis of hypertension 06/14/2017    Orientation RESPIRATION BLADDER Height & Weight     Self, Time, Situation, Place  Normal Continent Weight: 248 lb (112.5 kg) Height:  6' (182.9 cm)  BEHAVIORAL SYMPTOMS/MOOD NEUROLOGICAL BOWEL NUTRITION STATUS      Continent Diet(Clear liquid to be advanced)  AMBULATORY STATUS COMMUNICATION OF NEEDS Skin   Extensive Assist Verbally Surgical wounds(Incision Right Leg)                       Personal Care Assistance Level of Assistance  Bathing, Feeding, Dressing Bathing Assistance: Limited assistance Feeding assistance: Independent Dressing Assistance: Limited assistance     Functional Limitations Info  Sight, Hearing, Speech Sight Info: Adequate Hearing Info: Adequate Speech Info: Adequate    SPECIAL CARE FACTORS FREQUENCY  PT (By licensed PT), OT (By licensed OT)     PT Frequency: (5) OT Frequency: (5)            Contractures      Additional Factors Info  Code Status, Allergies Code Status Info: (Full Code) Allergies Info: (No Known Allergies)            Current Medications (07/11/2017):  This is the current hospital active medication list Current Facility-Administered Medications  Medication Dose Route Frequency Provider Last Rate Last Dose  . 0.9 %  sodium chloride infusion   Intravenous Continuous Juanell Fairly, MD 75 mL/hr at 07/11/17 1615    . acetaminophen (TYLENOL) tablet 650 mg  650 mg Oral Q4H PRN Juanell Fairly, MD       Or  . acetaminophen (TYLENOL) suppository 650 mg  650 mg Rectal Q4H PRN Juanell Fairly, MD      . alum & mag hydroxide-simeth (MAALOX/MYLANTA) 200-200-20 MG/5ML suspension 30 mL  30 mL Oral Q4H PRN Juanell Fairly, MD      . bisacodyl (DULCOLAX) EC tablet 5 mg  5 mg Oral Daily PRN Juanell Fairly, MD      . ceFAZolin (ANCEF) IVPB 2g/100 mL premix  2 g Intravenous Q6H Juanell Fairly, MD      . diphenhydrAMINE (BENADRYL) 12.5 MG/5ML elixir 12.5-25 mg  12.5-25 mg Oral Q4H PRN Juanell Fairly, MD      . docusate sodium (COLACE) capsule 100 mg  100 mg Oral BID Juanell Fairly, MD      . Melene Muller ON 07/12/2017] enoxaparin (LOVENOX) injection 30 mg  30 mg Subcutaneous Q12H Juanell Fairly, MD      . erythromycin ophthalmic ointment   Left Eye Q4H Lockie Mola, MD   1 application at 07/11/17 1309  . gabapentin (NEURONTIN) capsule 300 mg  300 mg Oral TID Juanell FairlyKrasinski, Kevin, MD   300 mg at 07/11/17 1619  . HYDROmorphone (DILAUDID) injection 1 mg  1 mg Intravenous Q2H PRN Juanell FairlyKrasinski, Kevin, MD      . magnesium citrate solution 1 Bottle  1 Bottle Oral Once PRN Juanell FairlyKrasinski, Kevin, MD      . menthol-cetylpyridinium (CEPACOL) lozenge 3 mg  1 lozenge Oral PRN Juanell FairlyKrasinski, Kevin, MD       Or  . phenol (CHLORASEPTIC) mouth spray 1 spray  1 spray Mouth/Throat PRN Juanell FairlyKrasinski, Kevin, MD      . methocarbamol (ROBAXIN) tablet 500 mg  500 mg Oral Q6H PRN Juanell FairlyKrasinski, Kevin, MD       Or  . methocarbamol (ROBAXIN) 500 mg in dextrose 5 % 50 mL IVPB  500 mg Intravenous Q6H PRN Juanell FairlyKrasinski, Kevin, MD      . ondansetron [facility](ZOFRAN)  tablet 4 mg  4 mg Oral Q6H PRN Juanell FairlyKrasinski, Kevin, MD       Or  . ondansetron [facility](ZOFRAN) injection 4 mg  4 mg Intravenous Q6H PRN Juanell FairlyKrasinski, Kevin, MD      . oxyCODONE (Oxy IR/ROXICODONE) immediate release tablet 10 mg  10 mg Oral Q3H PRN Juanell FairlyKrasinski, Kevin, MD   10 mg at 07/11/17 1621  . oxyCODONE (Oxy IR/ROXICODONE) immediate release tablet 5 mg  5 mg Oral Q3H PRN Juanell FairlyKrasinski, Kevin, MD      . polyethylene glycol (MIRALAX / GLYCOLAX) packet 17 g  17 g Oral Daily PRN Juanell FairlyKrasinski, Kevin, MD      . zolpidem (AMBIEN) tablet 5 mg  5 mg Oral QHS PRN,MR X 1 Juanell FairlyKrasinski, Kevin, MD         Discharge Medications: Please see discharge summary for a list of discharge medications.  Relevant Imaging Results:  Relevant Lab Results:   Additional Information    Hargun Spurling Annia BeltA Allon Costlow, Student-Social Work

## 2017-07-11 NOTE — Progress Notes (Signed)
Pt arrived to unit from PACU. Skin assessment completed with Asher MuirJamie, RN. Polar care running. IV infusing NS 5675ml/hr. Pt on room air. Pt complains of right knee pain. PRN oxycodone given. Pt oriented to unit. Call bell and phone within reach.

## 2017-07-11 NOTE — Anesthesia Procedure Notes (Signed)
Spinal  Patient location during procedure: OR Start time: 07/11/2017 8:00 AM End time: 07/11/2017 8:26 AM Staffing Anesthesiologist: Alvin Critchley, MD Resident/CRNA: Nelda Marseille, CRNA Performed: anesthesiologist and resident/CRNA  Preanesthetic Checklist Completed: patient identified, site marked, surgical consent, pre-op evaluation, timeout performed, IV checked, risks and benefits discussed and monitors and equipment checked Spinal Block Patient position: sitting Prep: Betadine Patient monitoring: heart rate, continuous pulse ox, blood pressure and cardiac monitor Approach: midline Location: L3-4 Injection technique: single-shot Needle Needle type: Introducer and Quincke  Needle gauge: 25 G Needle length: 9 cm Assessment Sensory level: T10 Additional Notes Negative paresthesia. Negative blood return. Positive free-flowing CSF. Expiration date of kit checked and confirmed. Patient tolerated procedure well, without complications. Attempt #1 unsuccessful and MDA at bedside.  Attempt #2 successful after several attempts.  Pt tolerated procedure well.  No complications noted.

## 2017-07-11 NOTE — Op Note (Signed)
DATE OF SURGERY:  07/11/2017 TIME: 11:52 AM  PATIENT NAME:  Cody Newman   AGE: 57 y.o.    PRE-OPERATIVE DIAGNOSIS:  M17.11 Unilateral Primary osteoarthritis, right knee  POST-OPERATIVE DIAGNOSIS:  Same  PROCEDURE:  Procedure(s): RIGHT TOTAL KNEE ARTHROPLASTY  SURGEON:  Juanell Fairly, MD   ASSISTANT:  Hurshel Keys, PA,   OPERATIVE IMPLANTS: Depuy PFC Sigma, Posterior Stabilized Femural component size 4, Tibia size rotating platform component size 5, Patella polyethylene 3-peg oval button size 38, with a 10 mm polyethylene insert.  PREOPERATIVE INDICATIONS:  Rashidi Loh is an 57 y.o. male who has a diagnosis of a right knee osteoarthritis and elected for a right total knee arthroplasty after failing nonoperative treatment.  His pain is causing significant impairment of their activities of daily living and work.  Radiographs have demonstrated tricompartmental osteoarthritis joint space narrowing, osteophytes, subchondral sclerosis and cyst formation.  The risks, benefits, and alternatives were discussed at length including but not limited to the risks of infection, bleeding, nerve or blood vessel injury, knee stiffness, fracture, dislocation, loosening or failure of the hardware and the need for further surgery. Medical risks include but not limited to DVT and pulmonary embolism, myocardial infarction, stroke, pneumonia, respiratory failure and death. I discussed these risks with the patient in my office prior to the date of surgery. They understood these risks and were willing to proceed.  OPERATIVE FINDINGS AND UNIQUE ASPECTS OF THE CASE:  Varus deformity with tricompartmental osteoarthritis of the right knee  OPERATIVE DESCRIPTION:  The patient was brought to the operative room and placed in a supine position after undergoing placement of a spinal anesthetic.  A Foley catheter was placed.  IV antibiotics were given. Patient received ancef and clindamycin. The lower extremity was prepped  and draped in the usual sterile fashion.  A time out was performed to verify the patient's name, date of birth, medical record number, correct site of surgery and correct procedure to be performed. The timeout was also used to confirm the patient received antibiotics and that appropriate instruments, implants and radiographs studies were available in the room.  The leg was elevated and exsanguinated with an Esmarch and the tourniquet was inflated to 275 mmHg for 130 minutes..  A midline incision was made over the right knee. Full-thickness skin flaps were developed. A medial parapatellar arthrotomy was then made and the patella everted and the knee was brought into 90 of flexion. Hoffa's fat pad along with the cruciate ligaments and medial and lateral menisci were resected.   The distal femoral intramedullary canal was opened with a drill and the intramedullary distal femoral cutting jig was inserted into the femoral canal pinned into position. It was set at 5 degrees resecting 10 mm off the distal femur.  Care was taken to protect the collateral ligaments during distal femoral resection.  The distal femoral resection was performed with an oscillating saw. The femoral cutting guide was then removed.  The extramedullary tibial cutting guide was then placed using the anterior tibial crest and second ray of the foot as a references.  The tibial cutting guide was adjusted to allow for appropriate posterior slope.  The tibial cutting block was pinned into position. The slotted stylus was used to measure the proximal tibial resection of 10 mm off the high lateral side.  The tibial long rod alignment guide was then used to confirm position of the cutting block. A third cross pin through the tibial cutting block was then drilled into position to  allow for rotational stability. Care was taken during the tibial resection to protect the medial and collateral ligaments.  The resected tibial bone was removed along with  the posterior horns of the menisci.  The PCL was sacrificed.  Extension gap was measured with a spacer block and alignment and extension was confirmed using a long alignment rod.  Careful dissection of the medial soft tissue structures including the deep MCL was performed.   Pie crusting of the superficial MCL was also performed to achieve adequate soft tissue balancing.  The attention was then turned back to the femur. The posterior referencing distal femoral sizing guide was applied to the distal femur.  The femur was sized to be a 4. Rotation of the referencing guide was checked with the epicondylar axis and Whitesides line. Then the 4-in-1 cutting jig was then applied to the distal femur. A stylus was used to confirm that the anterior femur would not be notched.   Then the anterior, posterior and chamfer femoral cuts were then made with an oscillating saw.  All posterior osteophytes were removed.  The flexion gap was then measured with a flexion spacer block and long alignment rod and was found to be symmetric with the extension gap and perpendicular to mechanical axis of the tibia.  The distal femoral preparation was completed by performing the posterior stabilized box cut using the cutting block. The entry site for the intramedullary femoral guide was filled with autologous bone graft from bone previously resected earlier in the case.  The proximal tibia plateau was then sized with trial trays. The best coverage was achieved with a size 5. This tibial tray was then pinned into position. The proximal tibia was then prepared with the reamer and keel punch.  After tibial preparation was completed, all trial components were inserted with polyethylene trials.  The knee was found to have excellent balance and full motion with a size 10 mm tibial polyethylene insert..    The attention was then turned to preparation of the patella. The thickness of the patella was measured with a caliper, the diameter measured  with the patella templates.  The patella resection was then made with an oscillating saw using the patella cutting guide.   3 peg holes for the patella component were then drilled. The trial patella was then placed. Knee was taken through a full range of motion and deemed to be stable with the trial components. All trial components were then removed.  The knee capsule was then injected with Exparel. The joint was copiously irrigated with pulse lavage.  The final total knee arthroplasty components were then cemented into place with a 10 mm trial polyethylene insert and all excess methylmethacrylate was removed.  The joint was again copiously irrigated. After the cement had hardened the knee was again taken through a full range of motion. It was felt to be most stable with the 10 mm tibial polyethylene insert. The actual tibial polyethylene insert was then placed.   The knee was taken through a range of motion and the patella tracked well and the knee was again irrigated copiously.    There was no significant bleeding after TXA was administered and therefore a drained was not deemed necessary.  The medial arthrotomy was closed with #1 Ethibond. The subcutaneous tissue closed with 0 and 2-0 vicryl, and skin approximated with staples.  A dry sterile and compressive dressing was applied.  A Polar Care was applied to the operative knee along with a knee immobilizer.  The patient was awakened and brought to the PACU in stable and satisfactory condition.  All sharp, lap and instrument counts were correct at the conclusion the case. I spoke with the patient's family in the postop consultation room to let them know the case had been performed without complication and the patient was stable in recovery room.   Total tourniquet time was 130 minutes.

## 2017-07-11 NOTE — H&P (Signed)
The patient has been re-examined, and the chart reviewed, and there have been no interval changes to the documented history and physical.    The risks, benefits, and alternatives have been discussed at length, and the patient is willing to proceed.   

## 2017-07-11 NOTE — Anesthesia Post-op Follow-up Note (Signed)
Anesthesia QCDR form completed.        

## 2017-07-12 ENCOUNTER — Encounter: Payer: Self-pay | Admitting: Orthopedic Surgery

## 2017-07-12 LAB — CBC
HCT: 38.6 % — ABNORMAL LOW (ref 40.0–52.0)
Hemoglobin: 12.7 g/dL — ABNORMAL LOW (ref 13.0–18.0)
MCH: 29.7 pg (ref 26.0–34.0)
MCHC: 32.9 g/dL (ref 32.0–36.0)
MCV: 90.3 fL (ref 80.0–100.0)
PLATELETS: 249 10*3/uL (ref 150–440)
RBC: 4.28 MIL/uL — AB (ref 4.40–5.90)
RDW: 13.2 % (ref 11.5–14.5)
WBC: 14 10*3/uL — AB (ref 3.8–10.6)

## 2017-07-12 LAB — BASIC METABOLIC PANEL
ANION GAP: 10 (ref 5–15)
BUN: 13 mg/dL (ref 6–20)
CALCIUM: 8.3 mg/dL — AB (ref 8.9–10.3)
CO2: 25 mmol/L (ref 22–32)
Chloride: 97 mmol/L — ABNORMAL LOW (ref 101–111)
Creatinine, Ser: 0.9 mg/dL (ref 0.61–1.24)
Glucose, Bld: 124 mg/dL — ABNORMAL HIGH (ref 65–99)
Potassium: 3.8 mmol/L (ref 3.5–5.1)
Sodium: 132 mmol/L — ABNORMAL LOW (ref 135–145)

## 2017-07-12 MED ORDER — OXYCODONE HCL 5 MG PO TABS: 5.0000 mg | ORAL_TABLET | ORAL | 0 refills | Status: DC | PRN

## 2017-07-12 MED ORDER — ENOXAPARIN SODIUM 30 MG/0.3ML ~~LOC~~ SOLN: 40.0000 mg | SUBCUTANEOUS | 0 refills | Status: DC

## 2017-07-12 MED ORDER — DOCUSATE SODIUM 100 MG PO CAPS: 100.0000 mg | ORAL_CAPSULE | Freq: Two times a day (BID) | ORAL | 0 refills | Status: DC

## 2017-07-12 MED ORDER — GABAPENTIN 300 MG PO CAPS: 300.0000 mg | ORAL_CAPSULE | Freq: Three times a day (TID) | ORAL | 0 refills | Status: DC

## 2017-07-12 NOTE — Evaluation (Signed)
Physical Therapy Evaluation Patient Details Name: Cody Newman MRN: [id number] DOB: 1960/08/27 Today's Date: 07/12/2017   History of Present Illness  Pt is a 57 y.o. male s/p R TKA secondary to unilateral primary OA 07/11/17.  Clinical Impression  Prior to hospital admission, pt was independent and working an active job.  Pt lives with his wife in 1 level home with 2 steps to enter with R railing.  Currently pt is min assist with bed mobility and transfers and CGA ambulating 12 feet with RW.  Pt with R KI already donned beginning of session in bed and kept KI donned for ambulation d/t R knee pain and to increase support for safety.  Pt's R knee pain 7/10 beginning of session and 10/10 end of session (nursing notified immediately regarding pt's request for pain meds).  R knee flexion AROM 77 degrees this morning.  Pt would benefit from skilled PT to address noted impairments and functional limitations (see below for any additional details).  Upon hospital discharge, recommend pt discharge to home with OP PT (pt reports having OP PT appointment set-up already for next week).    Follow Up Recommendations Outpatient PT    Equipment Recommendations  Rolling walker with 5" wheels    Recommendations for Other Services       Precautions / Restrictions Precautions Precautions: Knee;Fall Restrictions Weight Bearing Restrictions: Yes RLE Weight Bearing: Weight bearing as tolerated      Mobility  Bed Mobility Overal bed mobility: Needs Assistance Bed Mobility: Supine to Sit;Sit to Supine     Supine to sit: Min assist;HOB elevated(x2 trials) Sit to supine: Min assist;HOB elevated   General bed mobility comments: assist for R LE supine to/from sit; increased effort and time to perform; use of bedrail  Transfers Overall transfer level: Needs assistance Equipment used: Rolling walker (2 wheeled) Transfers: Sit to/from Stand Sit to Stand: Min assist         General transfer comment: pt  unable to stand first attempt but able to stand 2nd attempt with min assist to initiate stand; vc's for UE and LE placement required for sit to/from stand using RW  Ambulation/Gait Ambulation/Gait assistance: Min guard Ambulation Distance (Feet): 12 Feet Assistive device: Rolling walker (2 wheeled)   Gait velocity: decreased   General Gait Details: antalgic; decreased WB'ing and stance time R LE; use of R KI; vc's for gait technique and walker use; limited distance d/t R knee pain  Stairs            Wheelchair Mobility    Modified Rankin (Stroke Patients Only)       Balance Overall balance assessment: Needs assistance Sitting-balance support: No upper extremity supported Sitting balance-Leahy Scale: Good Sitting balance - Comments: steady sitting reaching within BOS   Standing balance support: Bilateral upper extremity supported Standing balance-Leahy Scale: Poor Standing balance comment: requires B UE support for static standing balance d/t R knee pain                             Pertinent Vitals/Pain Pain Assessment: 0-10 Pain Score: 10-Worst pain ever(7/10 at rest beginning of session; 10/10 post session resting in chair) Pain Location: R knee (pt also reporting back sore from multiple attempts at spinal anesthesia yesterday) Pain Descriptors / Indicators: Constant;Throbbing;Guarding;Grimacing Pain Intervention(s): Limited activity within patient's tolerance;Monitored during session;Premedicated before session;Repositioned;Patient requesting pain meds-RN notified;Ice applied  Vitals (HR and O2 on room air) stable and WFL throughout treatment  session.    Home Living Family/patient expects to be discharged to:: Private residence Living Arrangements: Spouse/significant other Available Help at Discharge: Family Type of Home: House Home Access: Stairs to enter Entrance Stairs-Rails: Right Entrance Stairs-Number of Steps: 2 Home Layout: One level Home  Equipment: None      Prior Function Level of Independence: Independent         Comments: Active physical job.  1 fall in past 6 months (tripped backwards and "bothered" L knee)     Hand Dominance        Extremity/Trunk Assessment   Upper Extremity Assessment Upper Extremity Assessment: Overall WFL for tasks assessed    Lower Extremity Assessment Lower Extremity Assessment: LLE deficits/detail;RLE deficits/detail RLE Deficits / Details: hip flexion at least 2+/5; knee flexion/extension at least 2+/5; DF at least 3+/5 RLE: Unable to fully assess due to pain LLE Deficits / Details: strength and ROM WFL    Cervical / Trunk Assessment Cervical / Trunk Assessment: Normal  Communication   Communication: No difficulties  Cognition Arousal/Alertness: Awake/alert Behavior During Therapy: WFL for tasks assessed/performed Overall Cognitive Status: Within Functional Limits for tasks assessed                                        General Comments General comments (skin integrity, edema, etc.): polar care, dressings, and R KI donned upon PT entry.  Nursing cleared pt for participation in physical therapy.  Pt agreeable to PT session.    Exercises Total Joint Exercises Ankle Circles/Pumps: AROM;Strengthening;Both;10 reps;Supine Quad Sets: AROM;Strengthening;Both;10 reps;Supine Short Arc Quad: AAROM;Strengthening;Right;10 reps;Supine Heel Slides: AAROM;Strengthening;Right;10 reps;Supine Hip ABduction/ADduction: AAROM;Strengthening;Right;10 reps;Supine Straight Leg Raises: AAROM;Strengthening;Right;10 reps;Supine Goniometric ROM: R knee extension AROM 10 degrees short of neutral semi-supine in bed; R knee flexion 77 degrees AROM sitting edge of bed   Assessment/Plan    PT Assessment Patient needs continued PT services  PT Problem List Decreased strength;Decreased range of motion;Decreased activity tolerance;Decreased balance;Decreased mobility;Decreased knowledge  of use of DME;Decreased knowledge of precautions;Pain       PT Treatment Interventions DME instruction;Gait training;Stair training;Functional mobility training;Therapeutic activities;Therapeutic exercise;Balance training;Patient/family education    PT Goals (Current goals can be found in the Care Plan section)  Acute Rehab PT Goals Patient Stated Goal: to have less knee pain PT Goal Formulation: With patient Time For Goal Achievement: 07/26/17 Potential to Achieve Goals: Good    Frequency BID   Barriers to discharge        Co-evaluation               AM-PAC PT "6 Clicks" Daily Activity  Outcome Measure Difficulty turning over in bed (including adjusting bedclothes, sheets and blankets)?: Unable Difficulty moving from lying on back to sitting on the side of the bed? : Unable Difficulty sitting down on and standing up from a chair with arms (e.g., wheelchair, bedside commode, etc,.)?: Unable Help needed moving to and from a bed to chair (including a wheelchair)?: A Little Help needed walking in hospital room?: A Little Help needed climbing 3-5 steps with a railing? : A Lot 6 Click Score: 11    End of Session Equipment Utilized During Treatment: Gait belt Activity Tolerance: Patient limited by pain Patient left: in chair;with call bell/phone within reach;with chair alarm set;with SCD's reapplied(B heels elevated via towel rolls; polar care in place and activated; R KI donned) Nurse Communication: Mobility status;Patient requests pain meds;Precautions;Weight  bearing status PT Visit Diagnosis: Other abnormalities of gait and mobility (R26.89);Muscle weakness (generalized) (M62.81);History of falling (Z91.81);Pain Pain - Right/Left: Right Pain - part of body: Knee    Time: 1610-9604 PT Time Calculation (min) (ACUTE ONLY): 48 min   Charges:   PT Evaluation $PT Eval Low Complexity: 1 Low PT Treatments $Therapeutic Exercise: 8-22 mins $Therapeutic Activity: 8-22 mins    PT G CodesHendricks Limes, PT 07/12/17, 10:12 AM [phone]

## 2017-07-12 NOTE — Care Management (Addendum)
RNCM spoke with patient regarding discharge planning. He uses [phone] for pharmacy needs. He will need front-wheeled walker and this has been [facility]. He states he has caregivers available in the home-Wife/daughter/daughter in law. On  23 January is first OPPT with [facility]. Per Dr. Martha ClanKrasinski patient will need Lovenox 40mg  injection daily for 4 weeks no refills. [facility]; patient updated.

## 2017-07-12 NOTE — Progress Notes (Signed)
Subjective:  POD #1 s/p right total knee arthroplasty.  Patient reports right knee pain as marked but responds to pain medication.  Patient's eye feels better.   He is OOB to a chair and tolerating a PO diet.  Objective:   VITALS:   Vitals:   07/11/17 1936 07/11/17 2350 07/12/17 0422 07/12/17 0740  BP: (!) 154/99 (!) 145/88 133/78 (!) 146/84  Pulse: 72 80 84 74  Resp: 16 18 18 18   Temp: 99.4 F (37.4 C) 99 F (37.2 C) 98.3 F (36.8 C) 98.6 F (37 C)  TempSrc: Oral Axillary Oral Oral  SpO2: 95% 98% 97% 95%  Weight:      Height:        PHYSICAL EXAM: Right lower extremity: I Personally changed patient's dressing today.  Patient no active drainage from incision. There is no erythema or ecchymosis or swelling. There is scant drainage on the bandage and a new honeycomb dressing was placed. An Ace wrap was placed over the right knee. His Polar Care was placed along with his knee immobilizer. Neurovascular intact Sensation intact distally Intact pulses distally Dorsiflexion/Plantar flexion intact Incision: scant drainage No cellulitis present Compartment soft  LABS  Results for orders placed or performed during the hospital encounter of 07/11/17 (from the past 24 hour(s))  CBC     Status: Abnormal   Collection Time: 07/12/17  3:42 AM  Result Value Ref Range   WBC 14.0 (H) 3.8 - 10.6 K/uL   RBC 4.28 (L) 4.40 - 5.90 MIL/uL   Hemoglobin 12.7 (L) 13.0 - 18.0 g/dL   HCT 16.1 (L) 09.6 - 04.5 %   MCV 90.3 80.0 - 100.0 fL   MCH 29.7 26.0 - 34.0 pg   MCHC 32.9 32.0 - 36.0 g/dL   RDW 40.9 81.1 - 91.4 %   Platelets 249 150 - 440 K/uL  Basic metabolic panel     Status: Abnormal   Collection Time: 07/12/17  3:42 AM  Result Value Ref Range   Sodium 132 (L) 135 - 145 mmol/L   Potassium 3.8 3.5 - 5.1 mmol/L   Chloride 97 (L) 101 - 111 mmol/L   CO2 25 22 - 32 mmol/L   Glucose, Bld 124 (H) 65 - 99 mg/dL   BUN 13 6 - 20 mg/dL   Creatinine, Ser 7.82 0.61 - 1.24 mg/dL   Calcium 8.3  (L) 8.9 - 10.3 mg/dL   GFR calc non Af Amer >60 >60 mL/min   GFR calc Af Amer >60 >60 mL/min   Anion gap 10 5 - 15    Dg Knee Right Port  Result Date: 07/11/2017 CLINICAL DATA:  Status post right total knee replacement. EXAM: PORTABLE RIGHT KNEE - 1-2 VIEW COMPARISON:  None. FINDINGS: The femoral and tibial components appear to be well situated. Expected postoperative changes are noted in the soft tissues anteriorly. No fracture or dislocation is noted. IMPRESSION: Status post right total knee arthroplasty. Electronically Signed   By: Lupita Raider, M.D.   On: 07/11/2017 12:06    Assessment/Plan: 1 Day Post-Op   Active Problems:   S/P TKR (total knee replacement) using cement, right  Patient well postop. Patient is still having right knee pain.  Continue current management with oxycodone and Dilaudid as needed. Continue physical therapy. Patient no lab abnormalities today. Continue to ice and elevate the right knee. Patient will be ready for discharge likely over the weekend.  He will be weightbearing as tolerated on right lower extremity. He'll follow  up with me in the office in 1-2 weeks. He'll be discharged on Lovenox 40 mg daily 4 weeks for DVT prophylaxis.    Juanell FairlyKRASINSKI, Arnitra Sokoloski , MD 07/12/2017, 1:16 PM

## 2017-07-12 NOTE — Progress Notes (Signed)
Physical Therapy Treatment Patient Details Name: Cody Newman MRN: [id number] DOB: [birth year] Today's Date: 07/12/2017    History of Present Illness Pt is a 57 y.o. 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    PT Comments    Pt able to progress to ambulating 60 feet with RW CGA; pt initially with increased gait speed and decreased WB'ing and stance time R LE but with vc's to slow down improved WB'ing R LE and gait mechanics noted. Pain 7/10 R knee beginning of session and "at least" 8/10 end of session (nursing notified for pain meds). Will continue to progress pt with strengthening, R knee ROM, and progressive functional mobility per pt tolerance.   Follow Up Recommendations  Outpatient PT     Equipment Recommendations  Rolling walker with 5" wheels    Recommendations for Other Services       Precautions / Restrictions Precautions Precautions: Knee;Fall Precaution Comments: KI on when in bed or chair (does not need for ambulation per verbal discussion with MD Martha Clan) Restrictions Weight Bearing Restrictions: Yes RLE Weight Bearing: Weight bearing as tolerated    Mobility  Bed Mobility Overal bed mobility: Needs Assistance Bed Mobility: Sit to Supine       Sit to supine: Min assist;HOB elevated   General bed mobility comments: assist for R LE sit to supine  Transfers Overall transfer level: Needs assistance Equipment used: Rolling walker (2 wheeled) Transfers: Sit to/from Stand Sit to Stand: Min guard         General transfer comment: CGA to stand from recliner and BSC; occasional vc's for R LE positioning required  Ambulation/Gait Ambulation/Gait assistance: Min guard Ambulation Distance (Feet): (20 feet (to bathroom); 60 feet) Assistive device: Rolling walker (2 wheeled)       General Gait Details: pt initially with increased cadence and decreased stance time R LE; pt requiring vc's to decrease gait speed and improved WB'ing noted  through R LE and also improved gait mechanics noted   Stairs            Wheelchair Mobility    Modified Rankin (Stroke Patients Only)       Balance Overall balance assessment: Needs assistance Sitting-balance support: No upper extremity supported Sitting balance-Leahy Scale: Good Sitting balance - Comments: steady sitting reaching within BOS   Standing balance support: No upper extremity supported Standing balance-Leahy Scale: Good Standing balance comment: steady standing urinating at toilet                            Cognition Arousal/Alertness: Awake/alert Behavior During Therapy: WFL for tasks assessed/performed Overall Cognitive Status: Within Functional Limits for tasks assessed                                        Exercises Total Joint Exercises Long Arc Quad: AAROM;Strengthening;Right;10 reps;Seated Knee Flexion: AROM;Right;Seated(x3 reps sitting with 15 seconds hold in flexed position)    General Comments General comments (skin integrity, edema, etc.): polar care, dressings, and R KI donned upon PT entry; wash cloth on pt's forehead.  Pt agreeable to PT session.      Pertinent Vitals/Pain Pain Assessment: 0-10 Pain Score: 8  Pain Location: Right knee Pain Descriptors / Indicators: Constant;Throbbing;Guarding;Grimacing Pain Intervention(s): Limited activity within patient's tolerance;Monitored during session;Premedicated before session;Repositioned;Patient requesting pain meds-RN notified;Ice applied  Vitals (  HR and O2 on room air) stable and WFL throughout treatment session.    Home Living Family/patient expects to be discharged to:: Private residence Living Arrangements: Spouse/significant other Available Help at Discharge: Family Type of Home: House Home Access: Stairs to enter Entrance Stairs-Rails: Right Home Layout: One level Home Equipment: None      Prior Function Level of Independence: Independent       Comments: Pt. was independent with ADLs, IADLs, and working with heavy machinery.   PT Goals (current goals can now be found in the care plan section) Acute Rehab PT Goals Patient Stated Goal: to return to normal activities PT Goal Formulation: With patient Time For Goal Achievement: 07/26/17 Potential to Achieve Goals: Good Progress towards PT goals: Progressing toward goals    Frequency    BID      PT Plan Current plan remains appropriate    Co-evaluation              AM-PAC PT "6 Clicks" Daily Activity  Outcome Measure  Difficulty turning over in bed (including adjusting bedclothes, sheets and blankets)?: A Little Difficulty moving from lying on back to sitting on the side of the bed? : Unable Difficulty sitting down on and standing up from a chair with arms (e.g., wheelchair, bedside commode, etc,.)?: Unable Help needed moving to and from a bed to chair (including a wheelchair)?: A Little Help needed walking in hospital room?: A Little Help needed climbing 3-5 steps with a railing? : A Little 6 Click Score: 14    End of Session Equipment Utilized During Treatment: Gait belt Activity Tolerance: Patient limited by pain Patient left: in bed;with call bell/phone within reach;with bed alarm set;with SCD's reapplied(B heels elevated via towel rolls; polar care in place and activated; KI in place R LE) Nurse Communication: Mobility status;Patient requests pain meds;Precautions;Weight bearing status PT Visit Diagnosis: Other abnormalities of gait and mobility (R26.89);Muscle weakness (generalized) (M62.81);History of falling (Z91.81);Pain Pain - Right/Left: Right Pain - part of body: Knee     Time: [id number] PT Time Calculation (min) (ACUTE ONLY): 38 min  Charges:  $Gait Training: 8-22 mins $Therapeutic Exercise: 8-22 mins $Therapeutic Activity: 8-22 mins                    G CodesHendricks Limes:       Tristy Udovich, PT 07/12/17, 3:01 PM [phone]

## 2017-07-12 NOTE — Progress Notes (Signed)
Clinical Social Worker (CSW) received SNF consult. PT is recommending outpatient PT. RN case manager aware of above. Please reconsult if future social work needs arise. CSW signing off.   Becker Christopher, LCSW [phone]  

## 2017-07-12 NOTE — Progress Notes (Signed)
Pt alert and oriented, asleep in chair. Family at bedside. PRN Dilaudid given for severe pain after working with PT. Pt on room air. IV infusing NS. Polar care running. No other complaints from pt at this time.

## 2017-07-12 NOTE — Evaluation (Signed)
Occupational Therapy Evaluation Patient Details Name: Cody MewJames Newman MRN: [id number] DOB: 1960/12/25 Today's Date: 07/12/2017    History of Present Illness Pt is a 57 y.o. 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   Clinical Impression   Upon arrival pt. Was sleeping with a a washcloth over his eyes. Pt. Reported 8/10 pain in knee. Pt. Reports that his MD was recently in, and worked with his knee. Pt. Presents with Pain, fatigue, and limited mobility which limit his ability to complete ADL, and IADL tasks. Pt. Resides at home with his wife. Pt. Was independent with all ADLs, IADLs, driving, and operating heavy equipment. Pt. Education was provided about A/E use for LE ADLs. Education was provided verbally, and through visual demonstration. Pt. Declined attempts to practice, or try A/E. Pt. Could benefit from OT services for ADL training, A/E training, and pt. Education about home modification, and DME. Pt. Plans to return home upon discharge with his wife to assist as needed.    Follow Up Recommendations  No OT follow up    Equipment Recommendations       Recommendations for Other Services       Precautions / Restrictions Precautions Precautions: Knee;Fall Restrictions Weight Bearing Restrictions: Yes RLE Weight Bearing: Weight bearing as tolerated      Mobility Bed Mobility                  Transfers Overall transfer level: Needs assistance Equipment used: Rolling walker (2 wheeled) Transfers: Sit to/from Stand Sit to Stand: Min assist         General transfer comment: Mobility per PT report    Balance                                           ADL either performed or assessed with clinical judgement   ADL Overall ADL's : Needs assistance/impaired Eating/Feeding: Independent   Grooming: Set up;Independent   Upper Body Bathing: Independent   Lower Body Bathing: Moderate assistance   Upper Body Dressing :  Independent   Lower Body Dressing: Moderate assistance(RIght LE)               Functional mobility during ADLs: Minimal assistance General ADL Comments: Pt. edcuation was provided about A/E use for LE ADLs.     Vision  In tact. Wears glasses all the time, and has had no change in vision.       Perception     Praxis      Pertinent Vitals/Pain Pain Assessment: 0-10 Pain Score: 8  Pain Location: Right knee Pain Descriptors / Indicators: Constant;Throbbing;Guarding;Grimacing Pain Intervention(s): Limited activity within patient's tolerance;Monitored during session;Premedicated before session;Repositioned;Ice applied     Hand Dominance Right   Extremity/Trunk Assessment Upper Extremity Assessment Upper Extremity Assessment: Overall WFL for tasks assessed           Communication Communication Communication: No difficulties   Cognition Arousal/Alertness: Awake/alert Behavior During Therapy: WFL for tasks assessed/performed Overall Cognitive Status: Within Functional Limits for tasks assessed                                     General Comments       Exercises     Shoulder Instructions      Home Living Family/patient expects  to be discharged to:: Private residence Living Arrangements: Spouse/significant other Available Help at Discharge: Family Type of Home: House Home Access: Stairs to enter Secretary/administrator of Steps: 2 Entrance Stairs-Rails: Right Home Layout: One level     Bathroom Shower/Tub: Walk-in shower;Curtain         Home Equipment: None          Prior Functioning/Environment Level of Independence: Independent        Comments: Pt. was independent with ADLs, IADLs, and working with heavy machinery.        OT Problem List: Pain;Decreased range of motion;Decreased knowledge of use of DME or AE      OT Treatment/Interventions: Self-care/ADL training;Patient/family education;DME and/or AE instruction    OT  Goals(Current goals can be found in the care plan section) Acute Rehab OT Goals Patient Stated Goal: To return to normal tasks without pain. OT Goal Formulation: With patient Potential to Achieve Goals: Good  OT Frequency: Min 2X/week   Barriers to D/C:            Co-evaluation              AM-PAC PT "6 Clicks" Daily Activity     Outcome Measure Help from another person eating meals?: None Help from another person taking care of personal grooming?: None Help from another person toileting, which includes using toliet, bedpan, or urinal?: A Little Help from another person bathing (including washing, rinsing, drying)?: A Little Help from another person to put on and taking off regular upper body clothing?: None Help from another person to put on and taking off regular lower body clothing?: A Little 6 Click Score: 21   End of Session    Activity Tolerance: Patient limited by pain;Patient limited by fatigue Patient left: in bed  OT Visit Diagnosis: Muscle weakness (generalized) (M62.81);Pain                Time: 1610-9604 OT Time Calculation (min): 24 min Charges:  OT General Charges $OT Visit: 1 Visit OT Evaluation $OT Eval Low Complexity: 1 Low G-Codes:     Olegario Messier, MS, OTR/L  Olegario Messier, MS, OTR/L 07/12/2017, 2:29 PM

## 2017-07-12 NOTE — Anesthesia Postprocedure Evaluation (Signed)
Anesthesia Post Note  Patient: Cody Newman  Procedure(s) Performed: TOTAL KNEE ARTHROPLASTY (Right )  Patient location during evaluation: Nursing Unit Anesthesia Type: Spinal Level of consciousness: oriented and awake and alert Pain management: pain level controlled Vital Signs Assessment: post-procedure vital signs reviewed and stable Respiratory status: spontaneous breathing and respiratory function stable Cardiovascular status: blood pressure returned to baseline and stable Postop Assessment: no headache and no apparent nausea or vomiting Anesthetic complications: no     Last Vitals:  Vitals:   07/12/17 0422 07/12/17 0740  BP: 133/78 (!) 146/84  Pulse: 84 74  Resp: 18 18  Temp: 36.8 C 37 C  SpO2: 97% 95%    Last Pain:  Vitals:   07/12/17 0740  TempSrc: Oral  PainSc: 8                  Mathews ArgyleLogan,  Tyrik Stetzer P

## 2017-07-13 LAB — CBC
HCT: 36.5 % — ABNORMAL LOW (ref 40.0–52.0)
Hemoglobin: 12.3 g/dL — ABNORMAL LOW (ref 13.0–18.0)
MCH: 30.2 pg (ref 26.0–34.0)
MCHC: 33.9 g/dL (ref 32.0–36.0)
MCV: 89.1 fL (ref 80.0–100.0)
Platelets: 236 10*3/uL (ref 150–440)
RBC: 4.1 MIL/uL — ABNORMAL LOW (ref 4.40–5.90)
RDW: 13.1 % (ref 11.5–14.5)
WBC: 11.9 10*3/uL — ABNORMAL HIGH (ref 3.8–10.6)

## 2017-07-13 MED ORDER — BISACODYL 10 MG RE SUPP
10.0000 mg | RECTAL | Status: AC
  Administered 2017-07-13: 10 mg via RECTAL
  Filled 2017-07-13: qty 1

## 2017-07-13 NOTE — Progress Notes (Signed)
Subjective: doing fairly well. Moderate pain.  Walked with pT   2 Days Post-Op Procedure(s) (LRB): TOTAL KNEE ARTHROPLASTY (Right)    Patient reports pain as moderate.  Objective:  Alert and oriented.  csm good distally.  Dressing dry  VITALS:   Vitals:   07/13/17 1149 07/13/17 1603  BP:  117/63  Pulse: 85 98  Resp:  18  Temp:  99 F (37.2 C)  SpO2: 98% 95%    Neurologically intact ABD soft Neurovascular intact Sensation intact distally Intact pulses distally Dorsiflexion/Plantar flexion intact Incision: no drainage  LABS Recent Labs    07/12/17 0342 07/13/17 0311  HGB 12.7* 12.3*  HCT 38.6* 36.5*  WBC 14.0* 11.9*  PLT 249 236    Recent Labs    07/12/17 0342  NA 132*  K 3.8  BUN 13  CREATININE 0.90  GLUCOSE 124*    No results for input(s): LABPT, INR in the last 72 hours.   Assessment/Plan: 2 Days Post-Op Procedure(s) (LRB): TOTAL KNEE ARTHROPLASTY (Right)   Up with therapy D/C IV fluids Plan for discharge tomorrow   RTC  07/17/17 as appointed  Lovenox on D/c for 4 weeks

## 2017-07-13 NOTE — Progress Notes (Signed)
Physical Therapy Treatment Patient Details Name: Cody Newman MRN: [id number] DOB: 06-Mar-1961 Today's Date: 07/13/2017    History of Present Illness Pt is a 57 y.o. 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    PT Comments    Pt agreeable to PT; reports 7/10 pain in R knee. Requires Min A for supine to sit to mobilize Right lower extremity over edge of bed and lowered to floor. Educated and performs QS for extension stretching and active range of motion/ active assisted range of motion R knee flexion stretching with breaks taken as needed for pain control. Active range R knee 14-78 degrees this session. Pt educated and encouraged to diligently work at QS with gravity assisted extension to attain full range for proper ambulation/mobility. Limited ambulation to chair, as pt had just ambulated to/from bathroom prior to session and post stretching is fatigued/sore. Plan to ambulated further in afternoon session. Continue to progress strength, range and endurance to improve all functional mobility.    Follow Up Recommendations  Outpatient PT     Equipment Recommendations  Rolling walker with 5" wheels    Recommendations for Other Services       Precautions / Restrictions Precautions Precautions: Knee;Fall Restrictions Weight Bearing Restrictions: Yes RLE Weight Bearing: Weight bearing as tolerated    Mobility  Bed Mobility Overal bed mobility: Needs Assistance Bed Mobility: Supine to Sit     Supine to sit: Min assist     General bed mobility comments: for RLE over edge of bed  Transfers Overall transfer level: Needs assistance Equipment used: Rolling walker (2 wheeled) Transfers: Sit to/from Stand Sit to Stand: Min guard         General transfer comment: Cues for hand placement and improved use of RLE  Ambulation/Gait Ambulation/Gait assistance: Min guard Ambulation Distance (Feet): 5 Feet Assistive device: Rolling walker (2 wheeled) Gait  Pattern/deviations: Step-to pattern   Gait velocity interpretation: <1.8 ft/sec, indicative of risk for recurrent falls General Gait Details: Bed to chair this session post stretching. Pt had just ambulated prior to session to/from bathroom.    Stairs            Wheelchair Mobility    Modified Rankin (Stroke Patients Only)       Balance Overall balance assessment: Needs assistance         Standing balance support: Bilateral upper extremity supported Standing balance-Leahy Scale: Fair                              Cognition Arousal/Alertness: Awake/alert Behavior During Therapy: WFL for tasks assessed/performed Overall Cognitive Status: Within Functional Limits for tasks assessed                                        Exercises Total Joint Exercises Quad Sets: Strengthening;Right;20 reps;Seated(seated at edge of bed. Also in long sit B 20x) Knee Flexion: AROM;AAROM;Right;10 reps;Seated(3 positions each rep with 10 sec hold each) Goniometric ROM: 14-78    General Comments        Pertinent Vitals/Pain Pain Assessment: 0-10 Pain Score: 7  Pain Location: Right knee Pain Descriptors / Indicators: Sore Pain Intervention(s): Limited activity within patient's tolerance;Monitored during session;Premedicated before session;Repositioned;Ice applied    Home Living  Prior Function            PT Goals (current goals can now be found in the care plan section) Progress towards PT goals: Progressing toward goals    Frequency    BID      PT Plan Current plan remains appropriate    Co-evaluation              AM-PAC PT "6 Clicks" Daily Activity  Outcome Measure  Difficulty turning over in bed (including adjusting bedclothes, sheets and blankets)?: A Little Difficulty moving from lying on back to sitting on the side of the bed? : Unable Difficulty sitting down on and standing up from a chair with  arms (e.g., wheelchair, bedside commode, etc,.)?: Unable Help needed moving to and from a bed to chair (including a wheelchair)?: A Little Help needed walking in hospital room?: A Little Help needed climbing 3-5 steps with a railing? : A Little 6 Click Score: 14    End of Session Equipment Utilized During Treatment: Gait belt Activity Tolerance: Patient limited by pain Patient left: in chair;with call bell/phone within reach;Other (comment)(polar care in place)   PT Visit Diagnosis: Other abnormalities of gait and mobility (R26.89);Muscle weakness (generalized) (M62.81);History of falling (Z91.81);Pain Pain - Right/Left: Right Pain - part of body: Knee     Time: 1610-9604 PT Time Calculation (min) (ACUTE ONLY): 36 min  Charges:  $Gait Training: 8-22 mins $Therapeutic Exercise: 8-22 mins                    G Codes:       Scot Dock, PTA 07/13/2017, 12:01 PM

## 2017-07-13 NOTE — Plan of Care (Signed)
  Progressing Education: Knowledge of General Education information will improve 07/13/2017 1108 - Progressing by Greer Eeampbell, Raylyn Speckman C, RN Health Behavior/Discharge Planning: Ability to manage health-related needs will improve 07/13/2017 1108 - Progressing by Greer Eeampbell, Razia Screws C, RN Clinical Measurements: Ability to maintain clinical measurements within normal limits will improve 07/13/2017 1108 - Progressing by Greer Eeampbell, Dequane Strahan C, RN Will remain free from infection 07/13/2017 1108 - Progressing by Greer Eeampbell, Tekoa Amon C, RN Diagnostic test results will improve 07/13/2017 1108 - Progressing by Greer Eeampbell, Millena Callins C, RN Respiratory complications will improve 07/13/2017 1108 - Progressing by Greer Eeampbell, Sophia Sperry C, RN Cardiovascular complication will be avoided 07/13/2017 1108 - Progressing by Greer Eeampbell, Amita Atayde C, RN Activity: Risk for activity intolerance will decrease 07/13/2017 1108 - Progressing by Greer Eeampbell, Corin Formisano C, RN Nutrition: Adequate nutrition will be maintained 07/13/2017 1108 - Progressing by Greer Eeampbell, Cataleya Cristina C, RN Coping: Level of anxiety will decrease 07/13/2017 1108 - Progressing by Greer Eeampbell, Estell Dillinger C, RN Elimination: Will not experience complications related to bowel motility 07/13/2017 1108 - Progressing by Greer Eeampbell, Keyunna Coco C, RN Will not experience complications related to urinary retention 07/13/2017 1108 - Progressing by Greer Eeampbell, Benedetta Sundstrom C, RN Pain Managment: General experience of comfort will improve 07/13/2017 1108 - Progressing by Greer Eeampbell, Bernardino Dowell C, RN Safety: Ability to remain free from injury will improve 07/13/2017 1108 - Progressing by Greer Eeampbell, Margarit Minshall C, RN Skin Integrity: Risk for impaired skin integrity will decrease 07/13/2017 1108 - Progressing by Greer Eeampbell, Kare Dado C, RN

## 2017-07-13 NOTE — Progress Notes (Signed)
Physical Therapy Treatment Patient Details Name: Cody Newman MRN: [id number] DOB: [birth year] Today's Date: 07/13/2017    History of Present Illness Pt is a 57 y.o. male who was admitted to [facility] with a Right TKA secondary to OA.    PT Comments    Pt agreeable to PT; reports 7/10 R knee and thigh pain initially, but decreased to 5 with ambulation. Progressing ambulation distance; requires instruction for slowed speed and greater attention to quality with reciprocal pattern. Pt inconsistent. Pt demonstrates good balance in static and dynamic stand. Pt continuing to work on range of motion of R knee. Immobilizer placed with return to bed per request. Continue PT to progress strength and range of motion of R knee/LE and to address stair training before discharge.   Follow Up Recommendations  Outpatient PT     Equipment Recommendations  Rolling walker with 5" wheels    Recommendations for Other Services       Precautions / Restrictions Precautions Precautions: Knee;Fall Restrictions Weight Bearing Restrictions: Yes RLE Weight Bearing: Weight bearing as tolerated    Mobility  Bed Mobility Overal bed mobility: Needs Assistance Bed Mobility: Sit to Supine     Supine to sit: Min assist Sit to supine: Min assist   General bed mobility comments: hook technique, but still requires some A  Transfers Overall transfer level: Needs assistance Equipment used: Rolling walker (2 wheeled) Transfers: Sit to/from Stand Sit to Stand: Supervision         General transfer comment: Cues for use of RLE; cues for R quad set several times prior to gait   Ambulation/Gait Ambulation/Gait assistance: Supervision Ambulation Distance (Feet): 130 Feet Assistive device: Rolling walker (2 wheeled) Gait Pattern/deviations: Step-to pattern;Step-through pattern(Partial step through)   Gait velocity interpretation: Below normal speed for age/gender General Gait Details: Instructed in more reciprocal  patttern and slowed speed to allow for improved quality; inconsistent   Stairs            Wheelchair Mobility    Modified Rankin (Stroke Patients Only)       Balance Overall balance assessment: Needs assistance         Standing balance support: Bilateral upper extremity supported Standing balance-Leahy Scale: Good                              Cognition Arousal/Alertness: Awake/alert Behavior During Therapy: WFL for tasks assessed/performed Overall Cognitive Status: Within Functional Limits for tasks assessed                                        Exercises Total Joint Exercises Quad Sets: Strengthening;Right;20 reps;Seated(seated at edge of bed. Also in long sit B 20x) Knee Flexion: AROM;AAROM;Right;10 reps;Seated(3 positions each rep with 10 sec hold each) Goniometric ROM: 14-78    General Comments        Pertinent Vitals/Pain Pain Assessment: 0-10 Pain Score: 5 (Initial 7 through quad; improved with walking this session) Pain Location: Right knee Pain Descriptors / Indicators: Sore Pain Intervention(s): Limited activity within patient's tolerance;Monitored during session;Premedicated before session;Repositioned;Ice applied    Home Living                      Prior Function            PT Goals (current goals can now be found in the care  plan section) Progress towards PT goals: Progressing toward goals    Frequency    BID      PT Plan Current plan remains appropriate    Co-evaluation              AM-PAC PT "6 Clicks" Daily Activity  Outcome Measure  Difficulty turning over in bed (including adjusting bedclothes, sheets and blankets)?: A Little Difficulty moving from lying on back to sitting on the side of the bed? : Unable Difficulty sitting down on and standing up from a chair with arms (e.g., wheelchair, bedside commode, etc,.)?: A Little Help needed moving to and from a bed to chair (including  a wheelchair)?: A Little Help needed walking in hospital room?: A Little Help needed climbing 3-5 steps with a railing? : A Little 6 Click Score: 16    End of Session Equipment Utilized During Treatment: Gait belt Activity Tolerance: Patient limited by pain Patient left: with call bell/phone within reach;in bed;with family/visitor present(polar care in place)   PT Visit Diagnosis: Other abnormalities of gait and mobility (R26.89);Muscle weakness (generalized) (M62.81);History of falling (Z91.81);Pain Pain - Right/Left: Right Pain - part of body: Knee     Time: 1431-1501 PT Time Calculation (min) (ACUTE ONLY): 30 min  Charges:  $Gait Training: 8-22 mins $Therapeutic Exercise: 8-22 mins                    G Codes:        Scot DockHeidi E Barnes, PTA 07/13/2017, 3:17 PM

## 2017-07-14 LAB — CBC
HEMATOCRIT: 35.1 % — AB (ref 40.0–52.0)
Hemoglobin: 12 g/dL — ABNORMAL LOW (ref 13.0–18.0)
MCH: 30.7 pg (ref 26.0–34.0)
MCHC: 34.1 g/dL (ref 32.0–36.0)
MCV: 90.2 fL (ref 80.0–100.0)
Platelets: 224 10*3/uL (ref 150–440)
RBC: 3.89 MIL/uL — ABNORMAL LOW (ref 4.40–5.90)
RDW: 13.3 % (ref 11.5–14.5)
WBC: 9.1 10*3/uL (ref 3.8–10.6)

## 2017-07-14 NOTE — Progress Notes (Signed)
Patient discharged home. Instructions and prescription given to patient, verbalized understanding. Wife transporting patient home.

## 2017-07-14 NOTE — Progress Notes (Signed)
Subjective: Feeling better this morning.  Already had PT.  Ready for discharge.  3 Days Post-Op Procedure(s) (LRB): TOTAL KNEE ARTHROPLASTY (Right)    Patient reports pain as mild.  Objective: Alert and oriented.  CSM good right leg.  VITALS:   Vitals:   07/14/17 0321 07/14/17 0717  BP: 123/80 133/75  Pulse: 96 87  Resp: 19 17  Temp: 97.7 F (36.5 C) 97.6 F (36.4 C)  SpO2: 98% 96%    Neurologically intact ABD soft Neurovascular intact Sensation intact distally Intact pulses distally Dorsiflexion/Plantar flexion intact Incision: no drainage  LABS Recent Labs    07/12/17 0342 07/13/17 0311 07/14/17 0350  HGB 12.7* 12.3* 12.0*  HCT 38.6* 36.5* 35.1*  WBC 14.0* 11.9* 9.1  PLT 249 236 224    Recent Labs    07/12/17 0342  NA 132*  K 3.8  BUN 13  CREATININE 0.90  GLUCOSE 124*    No results for input(s): LABPT, INR in the last 72 hours.   Assessment/Plan: 3 Days Post-Op Procedure(s) (LRB): TOTAL KNEE ARTHROPLASTY (Right)   Discharge home today. Start outpatient PT this week. Return to clinic as appointed to see Dr. Martha ClanKrasinski

## 2017-07-14 NOTE — Care Management Note (Signed)
Case Management Note  Patient Details  Name: Cody Newman MRN: [id number] Date of Birth: 04-Jan-1961  Subjective/Objective:      Discussed discharge planning with [name]. He has an OP-PT appointment set up by his orthopedist. [facility] has delivered a RW to him. He verbalized understanding to pick up his Lovenox after hospital discharge.               Action/Plan:   Expected Discharge Date:  07/14/17               Expected Discharge Plan:     In-House Referral:     Discharge planning Services     Post Acute Care Choice:    Choice offered to:     DME Arranged:    DME Agency:     HH Arranged:    HH Agency:     Status of Service:     If discussed at [employer] Meetings, dates discussed:    Additional Comments:  Wynnie Pacetti A, RN 07/14/2017, 9:59 AM

## 2017-07-14 NOTE — Progress Notes (Signed)
Physical Therapy Treatment Patient Details Name: Cody Newman MRN: [id number] DOB: 06/10/1961 Today's Date: 07/14/2017    History of Present Illness Pt is a 57 y.o. male who was admitted to [facility] with a Right TKA secondary to OA.    PT Comments    Pt showed great motivation despite having a lot of pain and generally being limited with AROM/strength.  He showed good pure quad set strength but struggled to engage the quad with knee extension AROM.  He showed great effort t/o the session and was eager to work with PT despite some frustration.  Pt was able to negotiate steps well, showed increased gait tolerance, cadence and distance and is making gains with srtenght, ROM and mobility.    Follow Up Recommendations  DC plan and follow up therapy as arranged by surgeon     Equipment Recommendations  Rolling walker with 5" wheels    Recommendations for Other Services       Precautions / Restrictions Precautions Precautions: Knee;Fall Restrictions RLE Weight Bearing: Weight bearing as tolerated    Mobility  Bed Mobility Overal bed mobility: Modified Independent Bed Mobility: Sit to Supine     Supine to sit: Supervision     General bed mobility comments: Pt needing L LE and R UE to get himself to EOB, but did so w/o direct PT assist  Transfers Overall transfer level: Modified independent Equipment used: Rolling walker (2 wheeled) Transfers: Sit to/from Stand Sit to Stand: Supervision         General transfer comment: Pt able to rise and control sitting with heavy UE use but no direct assist.    Ambulation/Gait Ambulation/Gait assistance: Supervision Ambulation Distance (Feet): 175 Feet Assistive device: Rolling walker (2 wheeled)       General Gait Details: Pt able to increase cadence, step length and WBing tolerance on R with cuing and some direct assist to advance walker more smoothly.     Stairs Stairs: Yes   Stair Management: Two rails;Step to  pattern;Forwards Number of Stairs: 4 General stair comments: Pt did well with stair negotiation and needed only minimal cuing to perform appropriately  Wheelchair Mobility    Modified Rankin (Stroke Patients Only)       Balance Overall balance assessment: Modified Independent Sitting-balance support: No upper extremity supported Sitting balance-Leahy Scale: Good       Standing balance-Leahy Scale: Good                              Cognition Arousal/Alertness: Awake/alert Behavior During Therapy: WFL for tasks assessed/performed Overall Cognitive Status: Within Functional Limits for tasks assessed                                        Exercises Total Joint Exercises Ankle Circles/Pumps: AROM;10 reps Quad Sets: Strengthening;15 reps Gluteal Sets: Strengthening;15 reps Short Arc Quad: AAROM;AROM;15 reps Heel Slides: AROM;AAROM;5 reps Hip ABduction/ADduction: Strengthening;15 reps Straight Leg Raises: AROM;AAROM;15 reps(Pt able to to the last few w/o direct assist) Knee Flexion: PROM;5 reps Goniometric ROM: 3-82    General Comments        Pertinent Vitals/Pain Pain Assessment: 0-10 Pain Score: 9  Pain Location: Right knee    Home Living                      Prior Function  PT Goals (current goals can now be found in the care plan section) Progress towards PT goals: Progressing toward goals    Frequency    BID      PT Plan Current plan remains appropriate    Co-evaluation              AM-PAC PT "6 Clicks" Daily Activity  Outcome Measure  Difficulty turning over in bed (including adjusting bedclothes, sheets and blankets)?: A Little Difficulty moving from lying on back to sitting on the side of the bed? : A Lot Difficulty sitting down on and standing up from a chair with arms (e.g., wheelchair, bedside commode, etc,.)?: A Little Help needed moving to and from a bed to chair (including a  wheelchair)?: A Little Help needed walking in hospital room?: A Little Help needed climbing 3-5 steps with a railing? : A Little 6 Click Score: 17    End of Session Equipment Utilized During Treatment: Gait belt Activity Tolerance: Patient limited by pain Patient left: with call bell/phone within reach;with family/visitor present;with chair alarm set Nurse Communication: Mobility status PT Visit Diagnosis: Other abnormalities of gait and mobility (R26.89);Muscle weakness (generalized) (M62.81);History of falling (Z91.81);Pain     Time: [id number] PT Time Calculation (min) (ACUTE ONLY): 45 min  Charges:  $Gait Training: 8-22 mins $Therapeutic Exercise: 8-22 mins $Therapeutic Activity: 8-22 mins                    G Codes:       Malachi ProGalen R Oliva Montecalvo, DPT 07/14/2017, 12:13 PM

## 2017-07-30 NOTE — Discharge Summary (Signed)
Physician Discharge Summary  Patient ID: Cody Newman MRN: [id number] DOB/AGE: [birth year] 57 y.o.  Admit date: 07/11/2017 Discharge date: 07/30/2017  Admission Diagnoses:  M17.11 Unilateral Primary osteoarthritis, right knee <principal problem not specified>  Discharge Diagnoses:  M17.11 Unilateral Primary osteoarthritis, right knee Active Problems:   S/P TKR (total knee replacement) using cement, right   Past Medical History:  Diagnosis Date  . Gout   . Osteoarthritis     Surgeries: Procedure(s): TOTAL KNEE ARTHROPLASTY on 07/11/2017   Consultants (if any):   Discharged Condition: Improved  Hospital Course: Cody Newman is an 57 y.o. male who was admitted 07/11/2017 with a diagnosis of  M17.11 Unilateral Primary osteoarthritis, right knee <principal problem not specified> and went to the operating room on 07/11/2017 and underwent an uncomplicated right TKA.    He was given perioperative antibiotics:  Anti-infectives (From admission, onward)   Start     Dose/Rate Route Frequency Ordered Stop   07/11/17 1600  ceFAZolin (ANCEF) IVPB 2g/100 mL premix     2 g 200 mL/hr over 30 Minutes Intravenous Every 6 hours 07/11/17 1549 07/11/17 2133   07/11/17 0600  ceFAZolin (ANCEF) IVPB 2g/100 mL premix     2 g 200 mL/hr over 30 Minutes Intravenous On call to O.R. 07/10/17 2150 07/11/17 0830   07/11/17 0559  clindamycin (CLEOCIN) 600 MG/50ML IVPB    Comments:  Gwynneth Aliment   : cabinet override      07/11/17 0559 07/11/17 0832   07/10/17 2200  clindamycin (CLEOCIN) IVPB 600 mg     600 mg 100 mL/hr over 30 Minutes Intravenous  Once 07/10/17 2150 07/11/17 0842    .  He was given sequential compression devices, early ambulation, and lovenox for DVT prophylaxis.  He benefited maximally from the hospital stay and there were no complications.    Recent vital signs:  Vitals:   07/14/17 0321 07/14/17 0717  BP: 123/80 133/75  Pulse: 96 87  Resp: 19 17  Temp: 97.7 F (36.5 C) 97.6 F  (36.4 C)  SpO2: 98% 96%    Recent laboratory studies:  Lab Results  Component Value Date   HGB 12.0 (L) 07/14/2017   HGB 12.3 (L) 07/13/2017   HGB 12.7 (L) 07/12/2017   Lab Results  Component Value Date   WBC 9.1 07/14/2017   PLT 224 07/14/2017   No results found for: INR Lab Results  Component Value Date   NA 132 (L) 07/12/2017   K 3.8 07/12/2017   CL 97 (L) 07/12/2017   CO2 25 07/12/2017   BUN 13 07/12/2017   CREATININE 0.90 07/12/2017   GLUCOSE 124 (H) 07/12/2017    Discharge Medications:   Allergies as of 07/14/2017   No Known Allergies     Medication List    STOP taking these medications   ibuprofen 200 MG tablet Commonly known as:  ADVIL,MOTRIN   indomethacin 50 MG capsule Commonly known as:  INDOCIN   meloxicam 15 MG tablet Commonly known as:  MOBIC   traMADol 50 MG tablet Commonly known as:  ULTRAM     TAKE these medications   docusate sodium 100 MG capsule Commonly known as:  COLACE Take 1 capsule (100 mg total) by mouth 2 (two) times daily.   enoxaparin 30 MG/0.3ML injection Commonly known as:  LOVENOX Inject 0.4 mLs (40 mg total) into the skin daily.   gabapentin 300 MG capsule Commonly known as:  NEURONTIN Take 1 capsule (300 mg total) by mouth 3 (three) times  daily.   oxyCODONE 5 MG immediate release tablet Commonly known as:  Oxy IR/ROXICODONE Take 1 tablet (5 mg total) by mouth every 4 (four) hours as needed (severe pain).       Diagnostic Studies: Chest 2 View  Result Date: 07/03/2017 CLINICAL DATA:  Preoperative evaluation for total knee replacement EXAM: CHEST  2 VIEW COMPARISON:  None. FINDINGS: Lungs are clear. Heart size and pulmonary vascularity are normal. No adenopathy. No evident bone lesions. IMPRESSION: No edema or consolidation. Electronically Signed   By: Bretta BangWilliam  Woodruff III M.D.   On: 07/03/2017 11:46   Dg Knee Right Port  Result Date: 07/11/2017 CLINICAL DATA:  Status post right total knee replacement. EXAM:  PORTABLE RIGHT KNEE - 1-2 VIEW COMPARISON:  None. FINDINGS: The femoral and tibial components appear to be well situated. Expected postoperative changes are noted in the soft tissues anteriorly. No fracture or dislocation is noted. IMPRESSION: Status post right total knee arthroplasty. Electronically Signed   By: Lupita RaiderJames  Green Jr, M.D.   On: 07/11/2017 12:06    Disposition: 01-Home or Self Care  Discharge Instructions    Call MD / Call 911   Complete by:  As directed    If you experience chest pain or shortness of breath, CALL 911 and be transported to the hospital emergency room.  If you develope a fever above 101 F, pus (white drainage) or increased drainage or redness at the wound, or calf pain, call your surgeon's office.   Constipation Prevention   Complete by:  As directed    Drink plenty of fluids.  Prune juice may be helpful.  You may use a stool softener, such as Colace (over the counter) 100 mg twice a day.  Use MiraLax (over the counter) for constipation as needed.   Diet general   Complete by:  As directed    Discharge instructions   Complete by:  As directed    Continue WBAT on the right lower extremity. Continue to use TED stockings until follow-up. Patient may remove them at night for sleep. Elevate the right lower extremity whenever possible. Continue to use knee immobilizer at night or when lying in bed or when elevating the operative leg. The patient may remove the knee immobilizer to perform exercises or sit in a chair. Continue using the  Polar Care for comfort. Keep incision clean and dry. Cover the right knee incision during showers with a plastic bag or Saran wrap. Take lovenox 40mg  injection a day for blood clot prevention. Continue to work on knee range of motion exercises at home as instructed by physical therapy. Continue to use a walker for assistance with ambulation until follow-up.   Driving restrictions   Complete by:  As directed    No driving for 6-8 weeks    Increase activity slowly as tolerated   Complete by:  As directed    Lifting restrictions   Complete by:  As directed    No lifting for 12-16 weeks         Signed: Juanell FairlyKRASINSKI, Wacey Zieger ,MD 07/30/2017, 8:02 AM

## 2018-05-09 IMAGING — CR DG CHEST 2V
1 series · 2 of 2 positions shown · non-contrast
Comparison: None.

CLINICAL DATA: Preoperative evaluation for total knee replacement

EXAM:
CHEST  2 VIEW

[Series 1: w chest pa · 0.14mm/px · 2 of 2 slices shown]
[im 1/2]
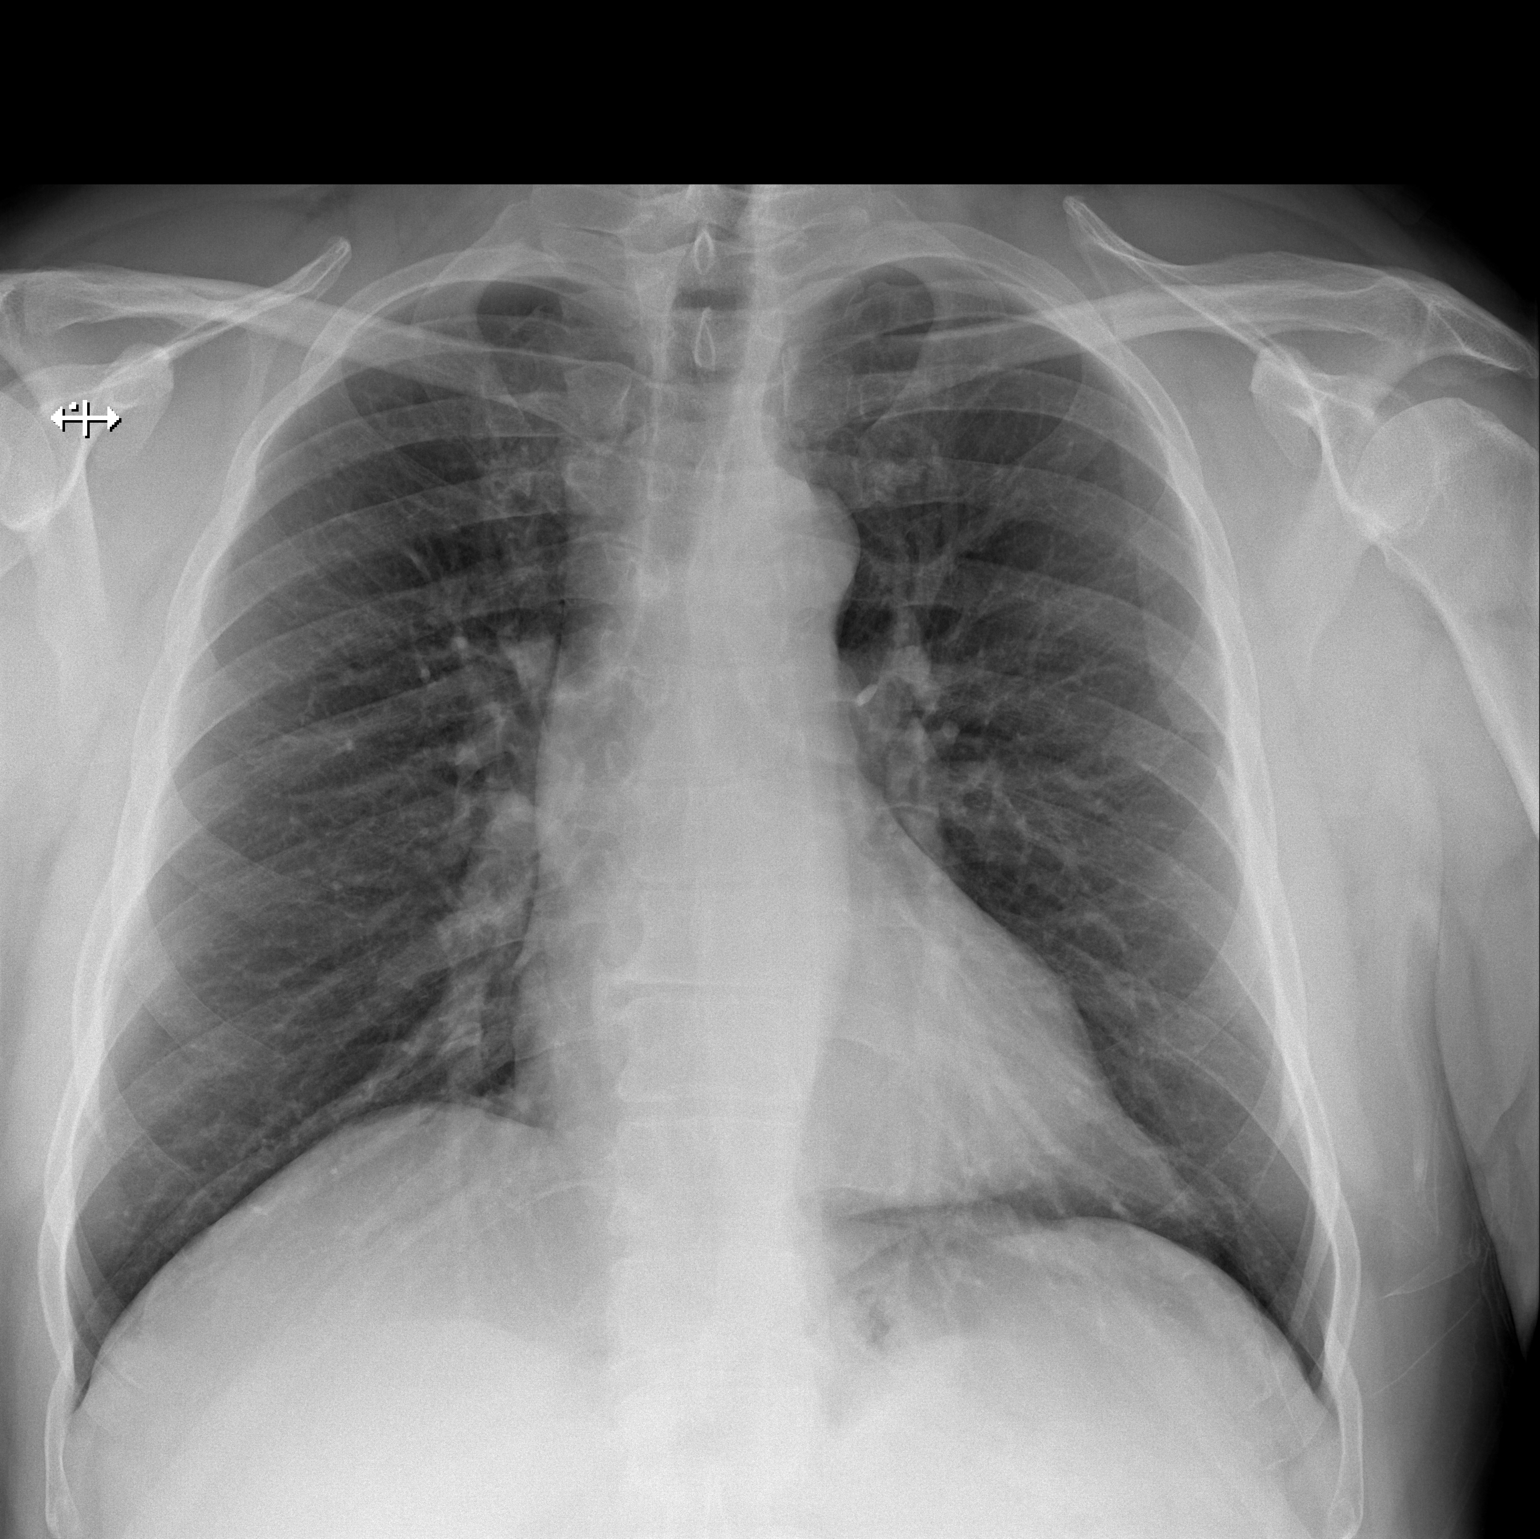
[im 2/2]
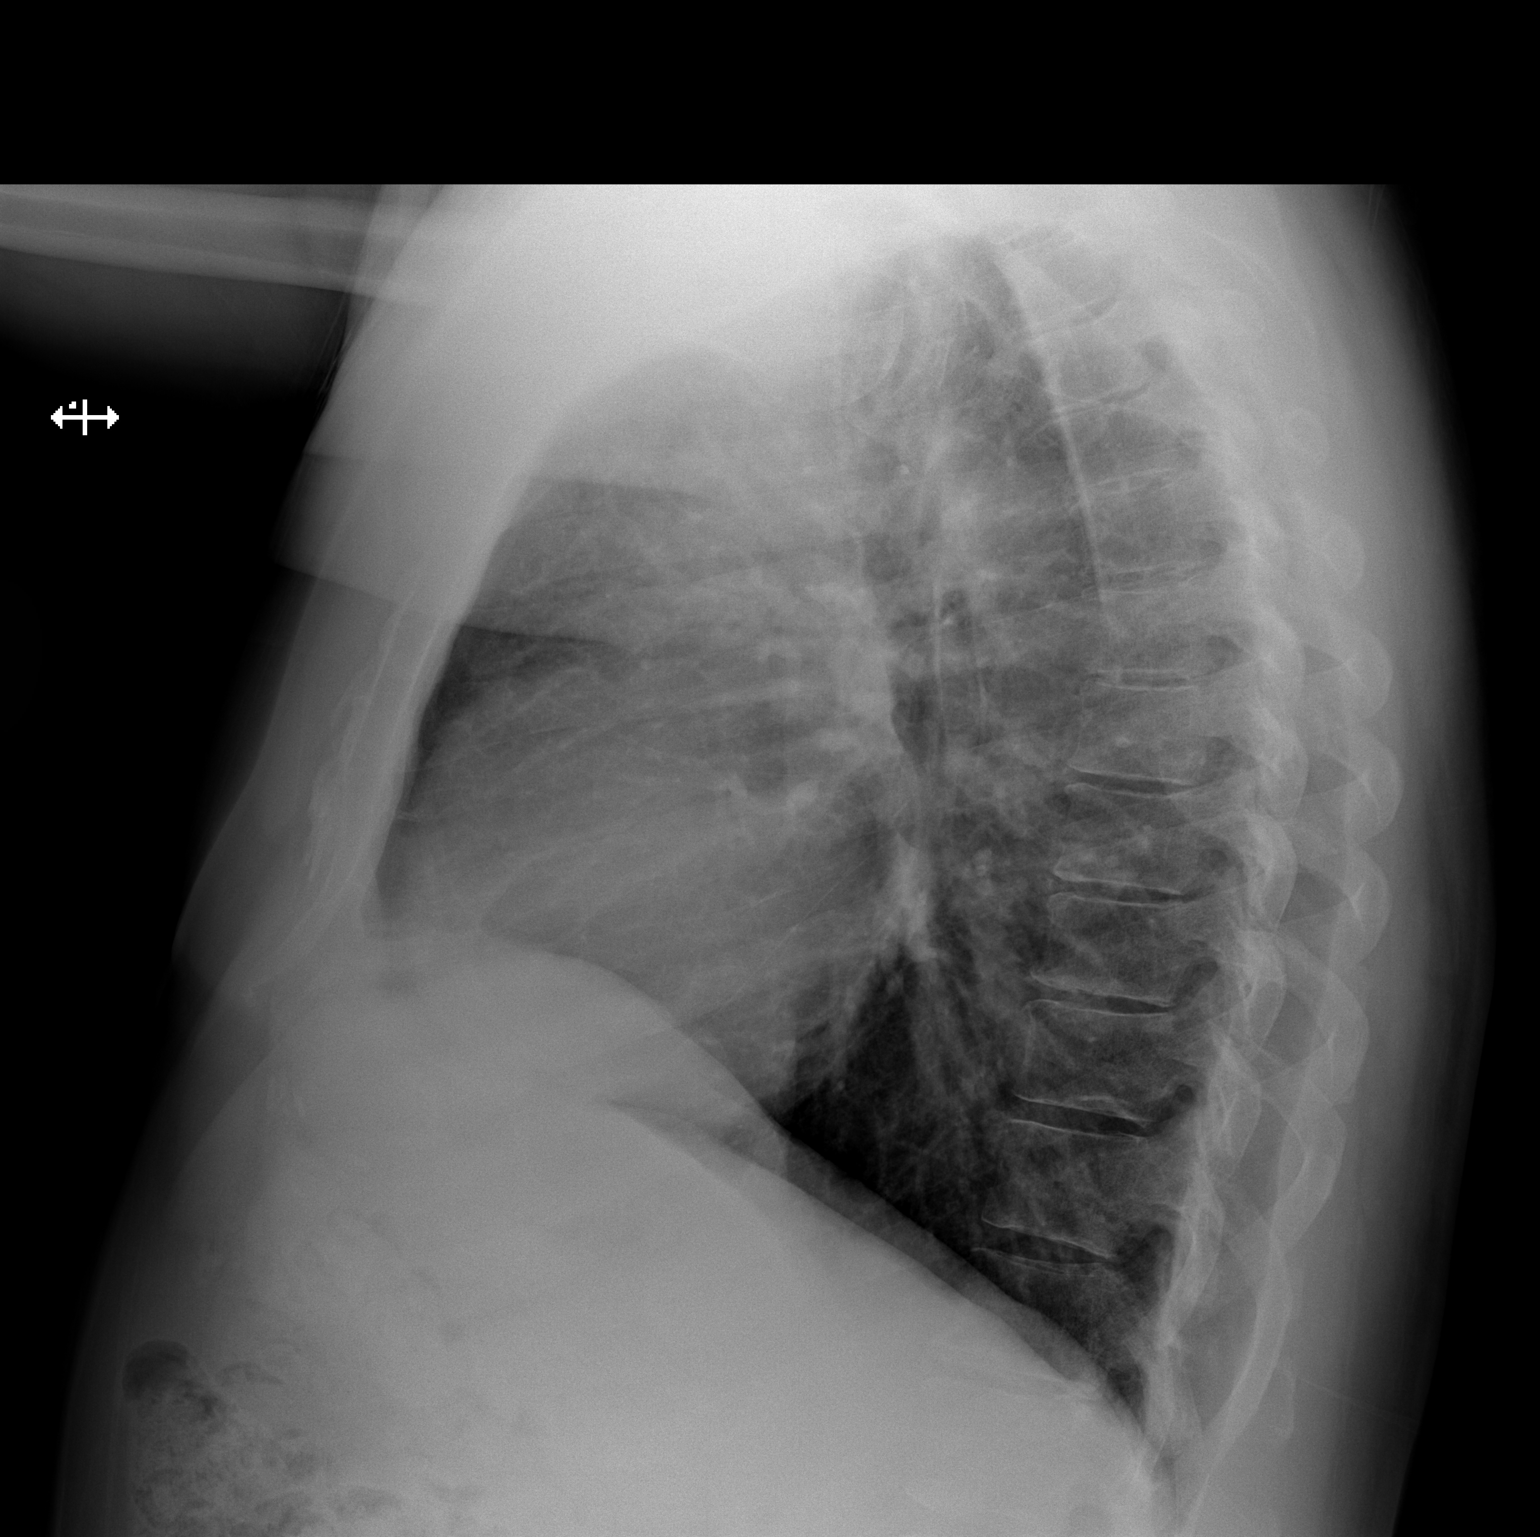

[2 of 2 positions shown; findings below may reference images not displayed]

FINDINGS: Lungs are clear. Heart size and pulmonary vascularity are normal. No
adenopathy. No evident bone lesions.
IMPRESSION: No edema or consolidation.

## 2018-05-17 IMAGING — DX DG KNEE 1-2V PORT*R*
2 series · 2 of 2 positions shown · non-contrast
Comparison: None.

CLINICAL DATA: Status post right total knee replacement.

EXAM:
PORTABLE RIGHT KNEE - 1-2 VIEW

[knee ap]
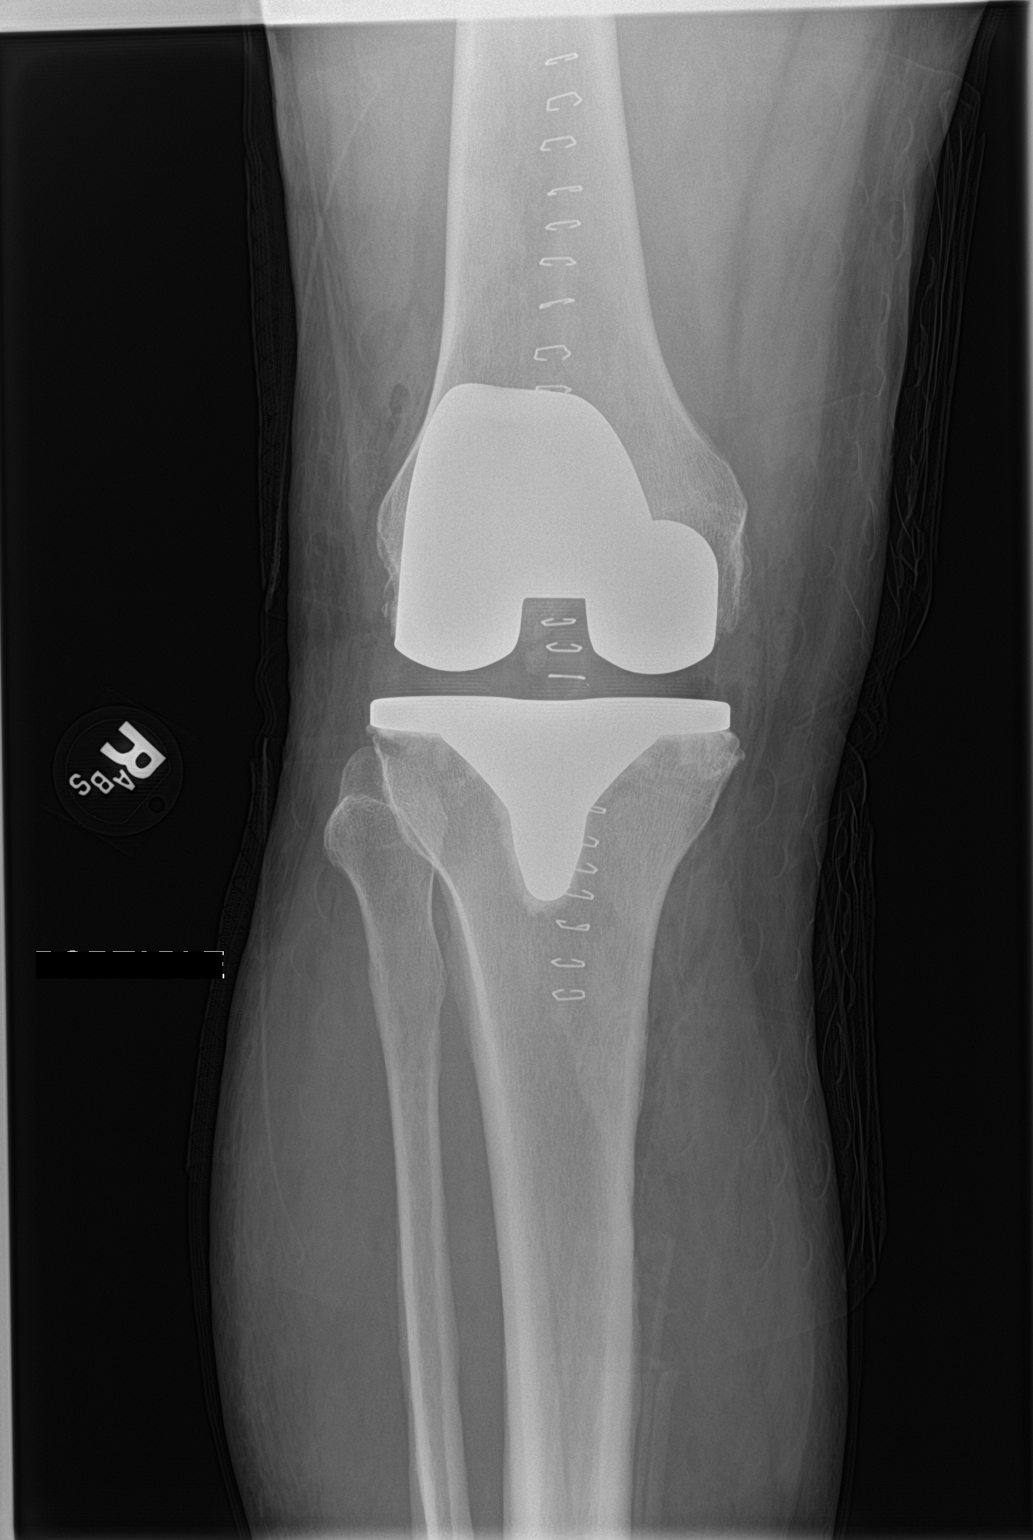

[knee lat]
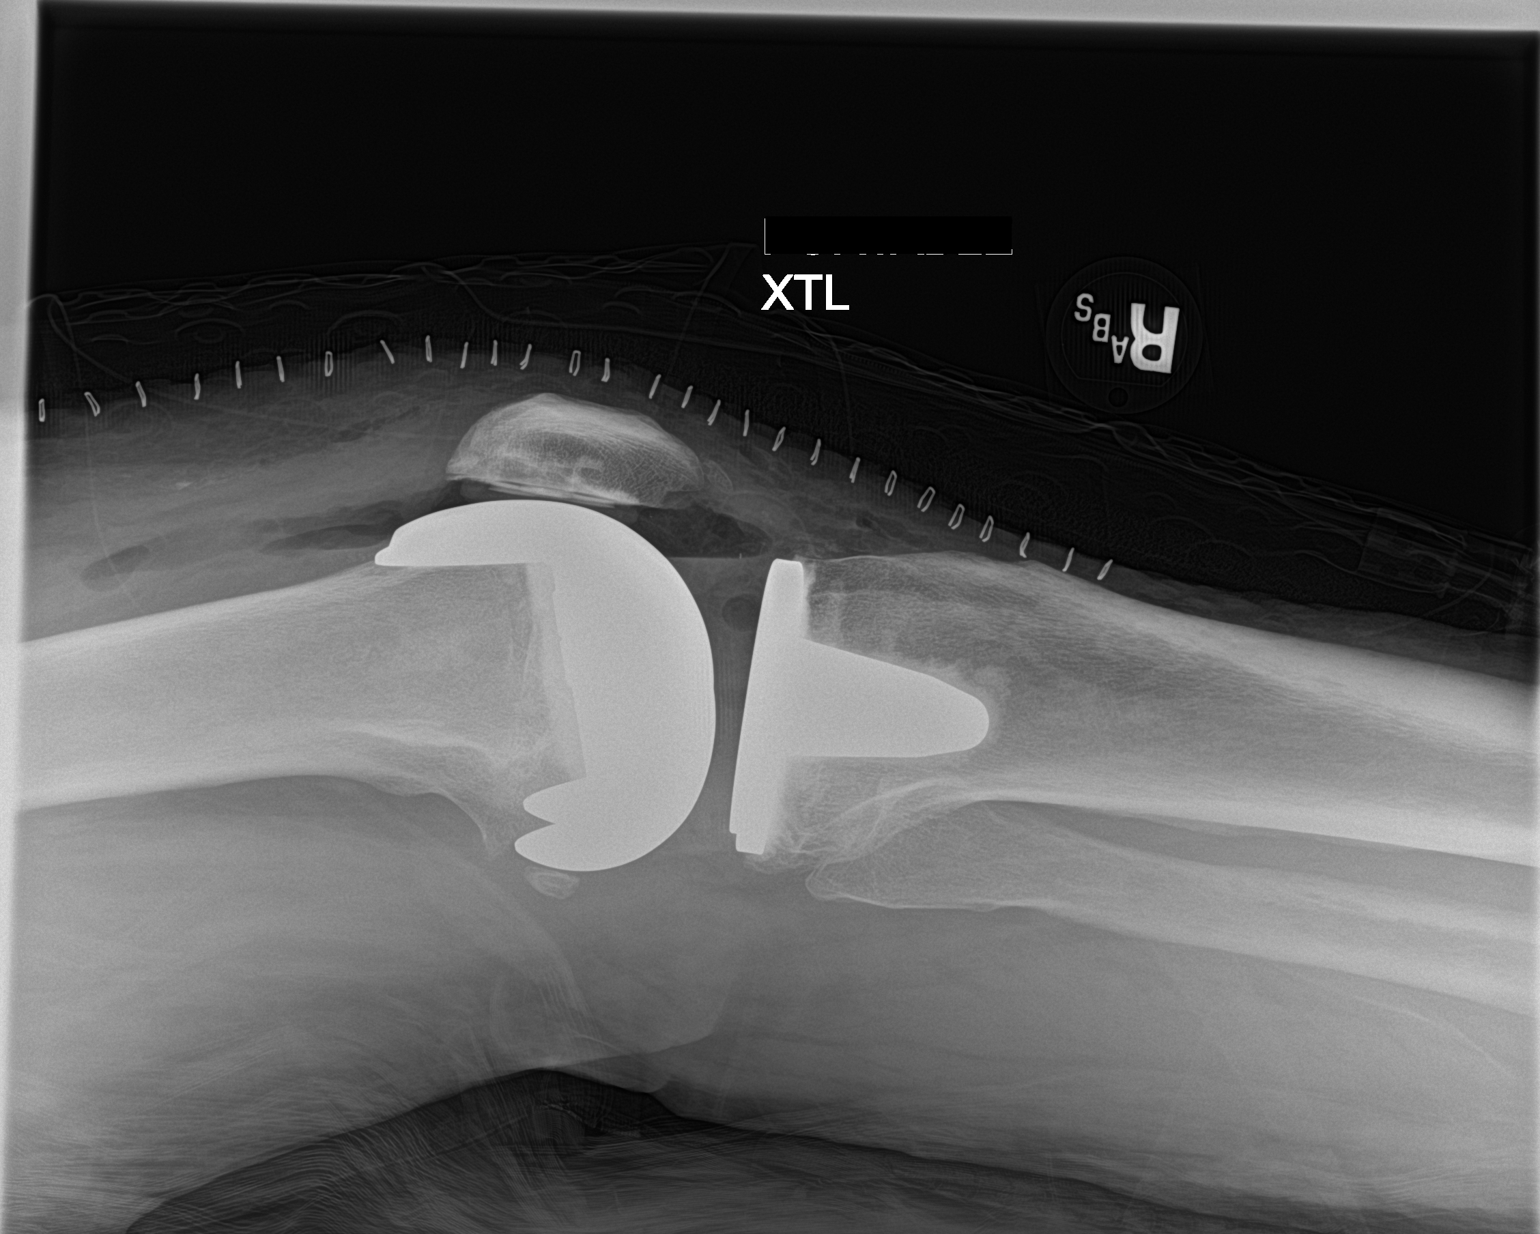

[2 of 2 positions shown; findings below may reference images not displayed]

FINDINGS: The femoral and tibial components appear to be well situated.
Expected postoperative changes are noted in the soft tissues
anteriorly. No fracture or dislocation is noted.
IMPRESSION: Status post right total knee arthroplasty.

## 2019-02-17 ENCOUNTER — Other Ambulatory Visit: Payer: Self-pay

## 2019-02-17 DIAGNOSIS — Z20822 Contact with and (suspected) exposure to covid-19: Secondary | ICD-10-CM

## 2019-02-18 ENCOUNTER — Telehealth: Payer: Self-pay | Admitting: Internal Medicine

## 2019-02-18 LAB — NOVEL CORONAVIRUS, NAA: SARS-CoV-2, NAA: NOT DETECTED

## 2019-02-18 NOTE — Telephone Encounter (Signed)
Patient called and received his covid test result °

## 2019-03-26 ENCOUNTER — Encounter: Payer: Self-pay | Admitting: *Deleted

## 2020-06-30 ENCOUNTER — Ambulatory Visit: Payer: BC Managed Care – PPO | Admitting: Family Medicine

## 2020-06-30 ENCOUNTER — Encounter: Payer: Self-pay | Admitting: Family Medicine

## 2020-06-30 ENCOUNTER — Other Ambulatory Visit: Payer: Self-pay

## 2020-06-30 VITALS — BP 143/89 | HR 96 | Temp 98.9°F | Ht 72.0 in | Wt 253.8 lb

## 2020-06-30 DIAGNOSIS — Z024 Encounter for examination for driving license: Secondary | ICD-10-CM

## 2020-06-30 NOTE — Assessment & Plan Note (Signed)
DOT Certificate provided x 2 years  Hearing test: Pass at 15' Vision: 20/13 R, 20/13 L, 20/13 Both Corrected  Urine 1.015, Neg Protein, Neg Glucose, Neg Hematuria

## 2020-06-30 NOTE — Progress Notes (Signed)
Subjective:    Patient ID: Cody Newman, male    DOB: [birth year], 60 y.o.   MRN: [id number]  Cody Newman is a 60 y.o. male presenting on 06/30/2020 for Employment Physical (DOT Physical)   HPI  Mr. Zarcone presents to clinic for DOT PE  No flowsheet data found.  Social History   Tobacco Use  . Smoking status: Never Smoker  . Smokeless tobacco: Never Used  Vaping Use  . Vaping Use: Never used  Substance Use Topics  . Alcohol use: Yes    Comment: Rare EtoH; tries to avoid due to gout.  . Drug use: No    Review of Systems  Constitutional: Negative.   HENT: Negative.   Eyes: Negative.   Respiratory: Negative.   Cardiovascular: Negative.   Gastrointestinal: Negative.   Endocrine: Negative.   Genitourinary: Negative.   Musculoskeletal: Negative.   Skin: Negative.   Allergic/Immunologic: Negative.   Neurological: Negative.   Hematological: Negative.   Psychiatric/Behavioral: Negative.    Per HPI unless specifically indicated above     Objective:    BP (!) 143/89 (BP Location: Right Arm, Patient Position: Sitting, Cuff Size: Large)   Pulse 96   Temp 98.9 F (37.2 C) (Temporal)   Ht 6' (1.829 m)   Wt 253 lb 12.8 oz (115.1 kg)   SpO2 99%   BMI 34.42 kg/m   Wt Readings from Last 3 Encounters:  06/30/20 253 lb 12.8 oz (115.1 kg)  07/11/17 248 lb (112.5 kg)  07/03/17 246 lb (111.6 kg)    Physical Exam Vitals reviewed.  Constitutional:      General: He is not in acute distress.    Appearance: Normal appearance. He is well-developed and well-groomed. He is obese. He is not ill-appearing or toxic-appearing.  HENT:     Head: Normocephalic.     Right Ear: Tympanic membrane, ear canal and external ear normal. There is no impacted cerumen.     Left Ear: Tympanic membrane, ear canal and external ear normal. There is no impacted cerumen.     Nose: Nose normal. No congestion or rhinorrhea.     Mouth/Throat:     Mouth: Mucous membranes are moist.     Pharynx: Oropharynx is  clear. No oropharyngeal exudate or posterior oropharyngeal erythema.  Eyes:     General: Lids are normal. Vision grossly intact. No scleral icterus.       Right eye: No discharge.        Left eye: No discharge.     Extraocular Movements: Extraocular movements intact.     Conjunctiva/sclera: Conjunctivae normal.     Pupils: Pupils are equal, round, and reactive to light.  Cardiovascular:     Rate and Rhythm: Normal rate and regular rhythm.     Pulses: Normal pulses.          Dorsalis pedis pulses are 2+ on the right side and 2+ on the left side.     Heart sounds: Normal heart sounds. No murmur heard. No friction rub. No gallop.   Pulmonary:     Effort: Pulmonary effort is normal. No respiratory distress.     Breath sounds: Normal breath sounds. No wheezing, rhonchi or rales.  Abdominal:     General: Abdomen is flat. Bowel sounds are normal. There is no distension.     Palpations: Abdomen is soft. There is no hepatomegaly, splenomegaly or mass.     Tenderness: There is no abdominal tenderness. There is no right CVA tenderness, left CVA tenderness,  guarding or rebound.     Hernia: No hernia is present.  Musculoskeletal:        General: Normal range of motion.     Cervical back: Normal range of motion and neck supple. No rigidity or tenderness.     Right lower leg: No edema.     Left lower leg: No edema.     Comments: Normal tone, 5/5 strength BUE & BLE  Feet:     Right foot:     Skin integrity: Skin integrity normal.     Left foot:     Skin integrity: Skin integrity normal.  Lymphadenopathy:     Cervical: No cervical adenopathy.  Skin:    General: Skin is warm and dry.     Capillary Refill: Capillary refill takes less than 2 seconds.  Neurological:     General: No focal deficit present.     Mental Status: He is alert and oriented to person, place, and time.     Cranial Nerves: Cranial nerves are intact. No cranial nerve deficit.     Sensory: Sensation is intact. No sensory  deficit.     Motor: Motor function is intact. No weakness.     Coordination: Coordination is intact. Coordination normal.     Gait: Gait is intact. Gait normal.     Deep Tendon Reflexes: Reflexes are normal and symmetric. Reflexes normal.  Psychiatric:        Attention and Perception: Attention and perception normal.        Mood and Affect: Mood and affect normal.        Speech: Speech normal.        Behavior: Behavior normal. Behavior is cooperative.        Thought Content: Thought content normal.        Cognition and Memory: Cognition and memory normal.        Judgment: Judgment normal.    Results for orders placed or performed in visit on 02/17/19  Novel Coronavirus, NAA ([facility])   Specimen: Oropharyngeal(OP) collection in vial transport medium   OROPHARYNGEA  TESTING  Result Value Ref Range   SARS-CoV-2, NAA Not Detected Not Detected      Assessment & Plan:   Problem List Items Addressed This Visit      Other   Encounter for Department of Transportation (DOT) examination for trucking license - Primary    DOT Certificate provided x 2 years  Hearing test: Pass at 15' Vision: 20/13 R, 20/13 L, 20/13 Both Corrected  Urine 1.015, Neg Protein, Neg Glucose, Neg Hematuria          No orders of the defined types were placed in this encounter.  Follow up plan: No follow-ups on file.   Charlaine Dalton, FNP Family Nurse Practitioner [facility] 06/30/2020, 2:52 PM

## 2020-07-09 ENCOUNTER — Other Ambulatory Visit: Payer: Self-pay

## 2020-07-09 DIAGNOSIS — Z20822 Contact with and (suspected) exposure to covid-19: Secondary | ICD-10-CM

## 2020-07-13 LAB — NOVEL CORONAVIRUS, NAA: SARS-CoV-2, NAA: DETECTED — AB

## 2020-10-28 DIAGNOSIS — M9901 Segmental and somatic dysfunction of cervical region: Secondary | ICD-10-CM | POA: Diagnosis not present

## 2020-10-28 DIAGNOSIS — M5412 Radiculopathy, cervical region: Secondary | ICD-10-CM | POA: Diagnosis not present

## 2020-10-28 DIAGNOSIS — M9902 Segmental and somatic dysfunction of thoracic region: Secondary | ICD-10-CM | POA: Diagnosis not present

## 2020-10-28 DIAGNOSIS — M9903 Segmental and somatic dysfunction of lumbar region: Secondary | ICD-10-CM | POA: Diagnosis not present

## 2020-12-09 DIAGNOSIS — M9903 Segmental and somatic dysfunction of lumbar region: Secondary | ICD-10-CM | POA: Diagnosis not present

## 2020-12-09 DIAGNOSIS — M9902 Segmental and somatic dysfunction of thoracic region: Secondary | ICD-10-CM | POA: Diagnosis not present

## 2020-12-09 DIAGNOSIS — M5412 Radiculopathy, cervical region: Secondary | ICD-10-CM | POA: Diagnosis not present

## 2020-12-09 DIAGNOSIS — M9901 Segmental and somatic dysfunction of cervical region: Secondary | ICD-10-CM | POA: Diagnosis not present

## 2021-01-20 DIAGNOSIS — M9902 Segmental and somatic dysfunction of thoracic region: Secondary | ICD-10-CM | POA: Diagnosis not present

## 2021-01-20 DIAGNOSIS — M9903 Segmental and somatic dysfunction of lumbar region: Secondary | ICD-10-CM | POA: Diagnosis not present

## 2021-01-20 DIAGNOSIS — M5412 Radiculopathy, cervical region: Secondary | ICD-10-CM | POA: Diagnosis not present

## 2021-01-20 DIAGNOSIS — M9901 Segmental and somatic dysfunction of cervical region: Secondary | ICD-10-CM | POA: Diagnosis not present

## 2021-03-01 DIAGNOSIS — M1000 Idiopathic gout, unspecified site: Secondary | ICD-10-CM | POA: Diagnosis not present

## 2021-03-01 DIAGNOSIS — M109 Gout, unspecified: Secondary | ICD-10-CM | POA: Diagnosis not present

## 2021-03-06 DIAGNOSIS — M9902 Segmental and somatic dysfunction of thoracic region: Secondary | ICD-10-CM | POA: Diagnosis not present

## 2021-03-06 DIAGNOSIS — M5412 Radiculopathy, cervical region: Secondary | ICD-10-CM | POA: Diagnosis not present

## 2021-03-06 DIAGNOSIS — M9901 Segmental and somatic dysfunction of cervical region: Secondary | ICD-10-CM | POA: Diagnosis not present

## 2021-03-06 DIAGNOSIS — M9903 Segmental and somatic dysfunction of lumbar region: Secondary | ICD-10-CM | POA: Diagnosis not present

## 2021-04-21 DIAGNOSIS — M9902 Segmental and somatic dysfunction of thoracic region: Secondary | ICD-10-CM | POA: Diagnosis not present

## 2021-04-21 DIAGNOSIS — M9901 Segmental and somatic dysfunction of cervical region: Secondary | ICD-10-CM | POA: Diagnosis not present

## 2021-04-21 DIAGNOSIS — M5412 Radiculopathy, cervical region: Secondary | ICD-10-CM | POA: Diagnosis not present

## 2021-04-21 DIAGNOSIS — M9903 Segmental and somatic dysfunction of lumbar region: Secondary | ICD-10-CM | POA: Diagnosis not present

## 2021-06-02 DIAGNOSIS — M9901 Segmental and somatic dysfunction of cervical region: Secondary | ICD-10-CM | POA: Diagnosis not present

## 2021-06-02 DIAGNOSIS — M9903 Segmental and somatic dysfunction of lumbar region: Secondary | ICD-10-CM | POA: Diagnosis not present

## 2021-06-02 DIAGNOSIS — M9902 Segmental and somatic dysfunction of thoracic region: Secondary | ICD-10-CM | POA: Diagnosis not present

## 2021-06-02 DIAGNOSIS — M5412 Radiculopathy, cervical region: Secondary | ICD-10-CM | POA: Diagnosis not present

## 2021-07-14 DIAGNOSIS — M9903 Segmental and somatic dysfunction of lumbar region: Secondary | ICD-10-CM | POA: Diagnosis not present

## 2021-07-14 DIAGNOSIS — M9902 Segmental and somatic dysfunction of thoracic region: Secondary | ICD-10-CM | POA: Diagnosis not present

## 2021-07-14 DIAGNOSIS — M9901 Segmental and somatic dysfunction of cervical region: Secondary | ICD-10-CM | POA: Diagnosis not present

## 2021-07-14 DIAGNOSIS — M5412 Radiculopathy, cervical region: Secondary | ICD-10-CM | POA: Diagnosis not present

## 2021-08-21 DIAGNOSIS — M9901 Segmental and somatic dysfunction of cervical region: Secondary | ICD-10-CM | POA: Diagnosis not present

## 2021-08-21 DIAGNOSIS — M5412 Radiculopathy, cervical region: Secondary | ICD-10-CM | POA: Diagnosis not present

## 2021-08-21 DIAGNOSIS — M9902 Segmental and somatic dysfunction of thoracic region: Secondary | ICD-10-CM | POA: Diagnosis not present

## 2021-08-21 DIAGNOSIS — M9903 Segmental and somatic dysfunction of lumbar region: Secondary | ICD-10-CM | POA: Diagnosis not present

## 2021-10-09 DIAGNOSIS — M9903 Segmental and somatic dysfunction of lumbar region: Secondary | ICD-10-CM | POA: Diagnosis not present

## 2021-10-09 DIAGNOSIS — M5412 Radiculopathy, cervical region: Secondary | ICD-10-CM | POA: Diagnosis not present

## 2021-10-09 DIAGNOSIS — M9901 Segmental and somatic dysfunction of cervical region: Secondary | ICD-10-CM | POA: Diagnosis not present

## 2021-10-09 DIAGNOSIS — M9902 Segmental and somatic dysfunction of thoracic region: Secondary | ICD-10-CM | POA: Diagnosis not present

## 2021-10-13 DIAGNOSIS — M9901 Segmental and somatic dysfunction of cervical region: Secondary | ICD-10-CM | POA: Diagnosis not present

## 2021-10-13 DIAGNOSIS — M5412 Radiculopathy, cervical region: Secondary | ICD-10-CM | POA: Diagnosis not present

## 2021-10-13 DIAGNOSIS — M9902 Segmental and somatic dysfunction of thoracic region: Secondary | ICD-10-CM | POA: Diagnosis not present

## 2021-10-13 DIAGNOSIS — M9903 Segmental and somatic dysfunction of lumbar region: Secondary | ICD-10-CM | POA: Diagnosis not present

## 2021-11-17 DIAGNOSIS — M9903 Segmental and somatic dysfunction of lumbar region: Secondary | ICD-10-CM | POA: Diagnosis not present

## 2021-11-17 DIAGNOSIS — M9902 Segmental and somatic dysfunction of thoracic region: Secondary | ICD-10-CM | POA: Diagnosis not present

## 2021-11-17 DIAGNOSIS — M5412 Radiculopathy, cervical region: Secondary | ICD-10-CM | POA: Diagnosis not present

## 2021-11-17 DIAGNOSIS — M9901 Segmental and somatic dysfunction of cervical region: Secondary | ICD-10-CM | POA: Diagnosis not present

## 2021-12-29 DIAGNOSIS — M5412 Radiculopathy, cervical region: Secondary | ICD-10-CM | POA: Diagnosis not present

## 2021-12-29 DIAGNOSIS — M9901 Segmental and somatic dysfunction of cervical region: Secondary | ICD-10-CM | POA: Diagnosis not present

## 2021-12-29 DIAGNOSIS — M9902 Segmental and somatic dysfunction of thoracic region: Secondary | ICD-10-CM | POA: Diagnosis not present

## 2021-12-29 DIAGNOSIS — M9903 Segmental and somatic dysfunction of lumbar region: Secondary | ICD-10-CM | POA: Diagnosis not present

## 2022-02-09 DIAGNOSIS — M9902 Segmental and somatic dysfunction of thoracic region: Secondary | ICD-10-CM | POA: Diagnosis not present

## 2022-02-09 DIAGNOSIS — M9903 Segmental and somatic dysfunction of lumbar region: Secondary | ICD-10-CM | POA: Diagnosis not present

## 2022-02-09 DIAGNOSIS — M5412 Radiculopathy, cervical region: Secondary | ICD-10-CM | POA: Diagnosis not present

## 2022-02-09 DIAGNOSIS — M9901 Segmental and somatic dysfunction of cervical region: Secondary | ICD-10-CM | POA: Diagnosis not present

## 2022-04-16 DIAGNOSIS — M9902 Segmental and somatic dysfunction of thoracic region: Secondary | ICD-10-CM | POA: Diagnosis not present

## 2022-04-16 DIAGNOSIS — M9901 Segmental and somatic dysfunction of cervical region: Secondary | ICD-10-CM | POA: Diagnosis not present

## 2022-04-16 DIAGNOSIS — M5412 Radiculopathy, cervical region: Secondary | ICD-10-CM | POA: Diagnosis not present

## 2022-04-16 DIAGNOSIS — M9903 Segmental and somatic dysfunction of lumbar region: Secondary | ICD-10-CM | POA: Diagnosis not present

## 2022-04-18 DIAGNOSIS — M9901 Segmental and somatic dysfunction of cervical region: Secondary | ICD-10-CM | POA: Diagnosis not present

## 2022-04-18 DIAGNOSIS — M5412 Radiculopathy, cervical region: Secondary | ICD-10-CM | POA: Diagnosis not present

## 2022-04-18 DIAGNOSIS — M9903 Segmental and somatic dysfunction of lumbar region: Secondary | ICD-10-CM | POA: Diagnosis not present

## 2022-04-18 DIAGNOSIS — M9902 Segmental and somatic dysfunction of thoracic region: Secondary | ICD-10-CM | POA: Diagnosis not present

## 2022-04-23 DIAGNOSIS — M9903 Segmental and somatic dysfunction of lumbar region: Secondary | ICD-10-CM | POA: Diagnosis not present

## 2022-04-23 DIAGNOSIS — M9901 Segmental and somatic dysfunction of cervical region: Secondary | ICD-10-CM | POA: Diagnosis not present

## 2022-04-23 DIAGNOSIS — M9902 Segmental and somatic dysfunction of thoracic region: Secondary | ICD-10-CM | POA: Diagnosis not present

## 2022-04-23 DIAGNOSIS — M5412 Radiculopathy, cervical region: Secondary | ICD-10-CM | POA: Diagnosis not present

## 2022-06-01 DIAGNOSIS — M9901 Segmental and somatic dysfunction of cervical region: Secondary | ICD-10-CM | POA: Diagnosis not present

## 2022-06-01 DIAGNOSIS — M9902 Segmental and somatic dysfunction of thoracic region: Secondary | ICD-10-CM | POA: Diagnosis not present

## 2022-06-01 DIAGNOSIS — M9903 Segmental and somatic dysfunction of lumbar region: Secondary | ICD-10-CM | POA: Diagnosis not present

## 2022-06-01 DIAGNOSIS — M5412 Radiculopathy, cervical region: Secondary | ICD-10-CM | POA: Diagnosis not present

## 2022-06-22 ENCOUNTER — Encounter: Payer: Self-pay | Admitting: Internal Medicine

## 2022-06-22 ENCOUNTER — Ambulatory Visit (INDEPENDENT_AMBULATORY_CARE_PROVIDER_SITE_OTHER): Payer: BC Managed Care – PPO | Admitting: Internal Medicine

## 2022-06-22 VITALS — BP 147/88 | HR 75 | Temp 97.5°F | Resp 18 | Ht 70.5 in | Wt 243.2 lb

## 2022-06-22 DIAGNOSIS — Z024 Encounter for examination for driving license: Secondary | ICD-10-CM

## 2022-06-22 NOTE — Progress Notes (Signed)
Commercial Driver Medical Examination   Cody Newman is a 61 y.o. male who presents today for a commercial driver fitness determination physical exam. The patient reports no problems. The following portions of the patient's history were reviewed and updated as appropriate: allergies, current medications, past family history, past medical history, past social history, past surgical history, and problem list. Review of Systems   Past Medical History:  Diagnosis Date   Gout    Osteoarthritis     Current Outpatient Medications  Medication Sig Dispense Refill   allopurinol (ZYLOPRIM) 100 MG tablet Take 200 mg by mouth daily.     indomethacin (INDOCIN) 50 MG capsule Take 50 mg by mouth 3 (three) times daily as needed.     No current facility-administered medications for this visit.    No Known Allergies  Family History  Problem Relation Age of Onset   Cancer Mother        Breast   Cancer Father    Alcohol abuse Father    Heart disease Neg Hx     Social History   Socioeconomic History   Marital status: Married    Spouse name: Not on file   Number of children: Not on file   Years of education: Not on file   Highest education level: Not on file  Occupational History   Not on file  Tobacco Use   Smoking status: Never   Smokeless tobacco: Never  Vaping Use   Vaping Use: Never used  Substance and Sexual Activity   Alcohol use: Yes    Comment: Rare EtoH; tries to avoid due to gout.   Drug use: No   Sexual activity: Not on file  Other Topics Concern   Not on file  Social History Narrative   Not on file   Social Determinants of Health   Financial Resource Strain: Not on file  Food Insecurity: Not on file  Transportation Needs: Not on file  Physical Activity: Not on file  Stress: Not on file  Social Connections: Not on file  Intimate Partner Violence: Not on file     Constitutional: Denies fever, malaise, fatigue, headache or abrupt weight changes.  HEENT: Denies  eye pain, eye redness, ear pain, ringing in the ears, wax buildup, runny nose, nasal congestion, bloody nose, or sore throat. Respiratory: Denies difficulty breathing, shortness of breath, cough or sputum production.   Cardiovascular: Denies chest pain, chest tightness, palpitations or swelling in the hands or feet.  Gastrointestinal: Denies abdominal pain, bloating, constipation, diarrhea or blood in the stool.  GU: Denies urgency, frequency, pain with urination, burning sensation, blood in urine, odor or discharge. Musculoskeletal: Denies decrease in range of motion, difficulty with gait, muscle pain or joint pain and swelling.  Skin: Denies redness, rashes, lesions or ulcercations.  Neurological: Denies dizziness, difficulty with memory, difficulty with speech or problems with balance and coordination.  Psych: Denies anxiety, depression, SI/HI.  No other specific complaints in a complete review of systems (except as listed in HPI above).   Objective:    Vision:  Uncorrected Corrected Horizontal Field of Vision  Right Eye  20/13 70 degrees  Left Eye   20/13 70 degrees  Both Eyes   20/13    Applicant canrecognize and distinguish among traffic control signals and devices showing standard red, green, and amber colors.  Applicant meets visual acuity requirement only when wearing corrective lenses.  Monocular Vision?: no   Hearing:        Right Ear  >  20ft     Left Ear  > 30ft        BP (!) 147/88   Pulse 75   Temp (!) 97.5 F (36.4 C) (Oral)   Resp 18   Ht 5' 10.5" (1.791 m)   Wt 243 lb 3.2 oz (110.3 kg)   BMI 34.40 kg/m   Wt Readings from Last 3 Encounters:  06/30/20 253 lb 12.8 oz (115.1 kg)  07/11/17 248 lb (112.5 kg)  07/03/17 246 lb (111.6 kg)    General: Appears his stated age, obese, in NAD. Skin: Warm, dry and intact. No rashes, lesions or ulcerations noted. HEENT: Head: normal shape and size; Eyes: sclera white, no icterus, conjunctiva pink, PERRLA and EOMs  intact; Ears: Tm's gray and intact, normal light reflex; Nose: mucosa pink and moist, septum midline; Throat/Mouth: Teeth present, mucosa pink and moist, no exudate, lesions or ulcerations noted.  Neck:  Neck supple, trachea midline. No masses, lumps or thyromegaly present.  Cardiovascular: Normal rate and rhythm. S1,S2 noted.  No murmur, rubs or gallops noted. No JVD or BLE edema. No carotid bruits noted. Pulmonary/Chest: Normal effort and positive vesicular breath sounds. No respiratory distress. No wheezes, rales or ronchi noted.  Abdomen: Soft and nontender. Normal bowel sounds. No distention or masses noted. Liver, spleen and kidneys non palpable. Musculoskeletal: Normal range of motion. No signs of joint swelling. Strength 5/5 BUE/BLE. No difficulty with gait.  Neurological: Alert and oriented. Cranial nerves II-XII grossly intact. Coordination normal.  Psychiatric: Mood and affect normal. Behavior is normal. Judgment and thought content normal.    BMET    Component Value Date/Time   NA 132 (L) 07/12/2017 0342   K 3.8 07/12/2017 0342   CL 97 (L) 07/12/2017 0342   CO2 25 07/12/2017 0342   GLUCOSE 124 (H) 07/12/2017 0342   BUN 13 07/12/2017 0342   CREATININE 0.90 07/12/2017 0342   CALCIUM 8.3 (L) 07/12/2017 0342   GFRNONAA >60 07/12/2017 0342   GFRAA >60 07/12/2017 0342    Lipid Panel  No results found for: "CHOL", "TRIG", "HDL", "CHOLHDL", "VLDL", "LDLCALC"  CBC    Component Value Date/Time   WBC 9.1 07/14/2017 0350   RBC 3.89 (L) 07/14/2017 0350   HGB 12.0 (L) 07/14/2017 0350   HCT 35.1 (L) 07/14/2017 0350   PLT 224 07/14/2017 0350   MCV 90.2 07/14/2017 0350   MCH 30.7 07/14/2017 0350   MCHC 34.1 07/14/2017 0350   RDW 13.3 07/14/2017 0350   LYMPHSABS 1.7 07/03/2017 0933   MONOABS 0.7 07/03/2017 0933   EOSABS 0.1 07/03/2017 0933   BASOSABS 0.1 07/03/2017 0933    Hgb A1C Lab Results  Component Value Date   HGBA1C 5.3 07/03/2017        Labs:  Specific  gravity: 1.010 Protein: Negative Glucose: Negative Blood: Negative  Assessment:    Healthy male exam.  Meets standards, but periodic monitoring required due to uncontrolled HTN.  Driver qualified only for 3 months.    Plan:    Medical examiners certificate completed and printed. Return as needed.     Nicki Reaper, NP

## 2022-07-13 DIAGNOSIS — M9902 Segmental and somatic dysfunction of thoracic region: Secondary | ICD-10-CM | POA: Diagnosis not present

## 2022-07-13 DIAGNOSIS — M9903 Segmental and somatic dysfunction of lumbar region: Secondary | ICD-10-CM | POA: Diagnosis not present

## 2022-07-13 DIAGNOSIS — M9901 Segmental and somatic dysfunction of cervical region: Secondary | ICD-10-CM | POA: Diagnosis not present

## 2022-07-13 DIAGNOSIS — M5412 Radiculopathy, cervical region: Secondary | ICD-10-CM | POA: Diagnosis not present

## 2022-08-24 DIAGNOSIS — M9901 Segmental and somatic dysfunction of cervical region: Secondary | ICD-10-CM | POA: Diagnosis not present

## 2022-08-24 DIAGNOSIS — M9902 Segmental and somatic dysfunction of thoracic region: Secondary | ICD-10-CM | POA: Diagnosis not present

## 2022-08-24 DIAGNOSIS — M5412 Radiculopathy, cervical region: Secondary | ICD-10-CM | POA: Diagnosis not present

## 2022-08-24 DIAGNOSIS — M9903 Segmental and somatic dysfunction of lumbar region: Secondary | ICD-10-CM | POA: Diagnosis not present

## 2022-08-28 DIAGNOSIS — M9903 Segmental and somatic dysfunction of lumbar region: Secondary | ICD-10-CM | POA: Diagnosis not present

## 2022-08-28 DIAGNOSIS — M9901 Segmental and somatic dysfunction of cervical region: Secondary | ICD-10-CM | POA: Diagnosis not present

## 2022-08-28 DIAGNOSIS — M5412 Radiculopathy, cervical region: Secondary | ICD-10-CM | POA: Diagnosis not present

## 2022-08-28 DIAGNOSIS — M9902 Segmental and somatic dysfunction of thoracic region: Secondary | ICD-10-CM | POA: Diagnosis not present

## 2022-08-31 DIAGNOSIS — M5412 Radiculopathy, cervical region: Secondary | ICD-10-CM | POA: Diagnosis not present

## 2022-08-31 DIAGNOSIS — M9901 Segmental and somatic dysfunction of cervical region: Secondary | ICD-10-CM | POA: Diagnosis not present

## 2022-08-31 DIAGNOSIS — M9903 Segmental and somatic dysfunction of lumbar region: Secondary | ICD-10-CM | POA: Diagnosis not present

## 2022-08-31 DIAGNOSIS — M9902 Segmental and somatic dysfunction of thoracic region: Secondary | ICD-10-CM | POA: Diagnosis not present

## 2022-09-20 ENCOUNTER — Ambulatory Visit (INDEPENDENT_AMBULATORY_CARE_PROVIDER_SITE_OTHER): Payer: BC Managed Care – PPO | Admitting: Internal Medicine

## 2022-09-20 ENCOUNTER — Encounter: Payer: Self-pay | Admitting: Internal Medicine

## 2022-09-20 VITALS — BP 150/98 | HR 74 | Temp 96.9°F | Ht 72.0 in | Wt 243.0 lb

## 2022-09-20 DIAGNOSIS — R7309 Other abnormal glucose: Secondary | ICD-10-CM | POA: Diagnosis not present

## 2022-09-20 DIAGNOSIS — M1A0790 Idiopathic chronic gout, unspecified ankle and foot, without tophus (tophi): Secondary | ICD-10-CM | POA: Diagnosis not present

## 2022-09-20 DIAGNOSIS — Z1159 Encounter for screening for other viral diseases: Secondary | ICD-10-CM | POA: Diagnosis not present

## 2022-09-20 DIAGNOSIS — I10 Essential (primary) hypertension: Secondary | ICD-10-CM | POA: Diagnosis not present

## 2022-09-20 DIAGNOSIS — Z6832 Body mass index (BMI) 32.0-32.9, adult: Secondary | ICD-10-CM

## 2022-09-20 DIAGNOSIS — Z114 Encounter for screening for human immunodeficiency virus [HIV]: Secondary | ICD-10-CM

## 2022-09-20 DIAGNOSIS — Z136 Encounter for screening for cardiovascular disorders: Secondary | ICD-10-CM | POA: Diagnosis not present

## 2022-09-20 DIAGNOSIS — E6609 Other obesity due to excess calories: Secondary | ICD-10-CM | POA: Insufficient documentation

## 2022-09-20 LAB — CBC
MCH: 29.8 pg (ref 27.0–33.0)
RBC: 5.03 10*6/uL (ref 4.20–5.80)
WBC: 5.7 10*3/uL (ref 3.8–10.8)

## 2022-09-20 MED ORDER — OLMESARTAN MEDOXOMIL 20 MG PO TABS: 20.0000 mg | ORAL_TABLET | Freq: Every day | ORAL | 1 refills | Status: DC

## 2022-09-20 NOTE — Assessment & Plan Note (Signed)
Will check uric acid level today Encourage low purine diet

## 2022-09-20 NOTE — Patient Instructions (Signed)

## 2022-09-20 NOTE — Assessment & Plan Note (Signed)
Uncontrolled off meds Will start olmesartan 20 mg daily Reinforced DASH diet and exercise for weight loss C-Met today

## 2022-09-20 NOTE — Assessment & Plan Note (Signed)
Encourage diet and exercise for weight loss 

## 2022-09-20 NOTE — Progress Notes (Signed)
HPI  Patient presents to clinic today to establish care and for management of the conditions listed below.  HTN: His BP today is 150/98. He is not currently on any antihypertensive medication. ECG from 05/2017 reviewed.  Gout: He denies recent flare. He no longer takes Allopurinol or Indomethacin.    Past Medical History:  Diagnosis Date   Gout    Osteoarthritis     Current Outpatient Medications  Medication Sig Dispense Refill   allopurinol (ZYLOPRIM) 100 MG tablet Take 200 mg by mouth daily. (Patient not taking: Reported on 06/22/2022)     indomethacin (INDOCIN) 50 MG capsule Take 50 mg by mouth 3 (three) times daily as needed. (Patient not taking: Reported on 06/22/2022)     No current facility-administered medications for this visit.    No Known Allergies  Family History  Problem Relation Age of Onset   Cancer Mother        Breast   Cancer Father    Alcohol abuse Father    Heart disease Neg Hx     Social History   Socioeconomic History   Marital status: Married    Spouse name: Not on file   Number of children: Not on file   Years of education: Not on file   Highest education level: Not on file  Occupational History   Not on file  Tobacco Use   Smoking status: Never   Smokeless tobacco: Never  Vaping Use   Vaping Use: Never used  Substance and Sexual Activity   Alcohol use: Not Currently    Comment: Rare EtoH; tries to avoid due to gout.   Drug use: No   Sexual activity: Not on file  Other Topics Concern   Not on file  Social History Narrative   Not on file   Social Determinants of Health   Financial Resource Strain: Not on file  Food Insecurity: Not on file  Transportation Needs: Not on file  Physical Activity: Not on file  Stress: Not on file  Social Connections: Not on file  Intimate Partner Violence: Not on file    ROS:  Constitutional: Denies fever, malaise, fatigue, headache or abrupt weight changes.  HEENT: Denies eye pain, eye  redness, ear pain, ringing in the ears, wax buildup, runny nose, nasal congestion, bloody nose, or sore throat. Respiratory: Denies difficulty breathing, shortness of breath, cough or sputum production.   Cardiovascular: Denies chest pain, chest tightness, palpitations or swelling in the hands or feet.  Gastrointestinal: Denies abdominal pain, bloating, constipation, diarrhea or blood in the stool.  GU: Denies frequency, urgency, pain with urination, blood in urine, odor or discharge. Musculoskeletal: Denies decrease in range of motion, difficulty with gait, muscle pain or joint pain and swelling.  Skin: Denies redness, rashes, lesions or ulcercations.  Neurological: Denies dizziness, difficulty with memory, difficulty with speech or problems with balance and coordination.  Psych: Denies anxiety, depression, SI/HI.  No other specific complaints in a complete review of systems (except as listed in HPI above).  PE:  BP (!) 150/98 (BP Location: Right Arm, Patient Position: Sitting, Cuff Size: Normal)   Pulse 74   Temp (!) 96.9 F (36.1 C) (Temporal)   Ht 6' (1.829 m)   Wt 243 lb (110.2 kg)   SpO2 99%   BMI 32.96 kg/m  Wt Readings from Last 3 Encounters:  09/20/22 243 lb (110.2 kg)  06/22/22 243 lb 3.2 oz (110.3 kg)  06/30/20 253 lb 12.8 oz (115.1 kg)    General:  Appears his stated age, obese, in NAD. HEENT: Head: normal shape and size; Eyes: sclera white, no icterus, conjunctiva pink, PERRLA and EOMs intact;  Cardiovascular: Normal rate and rhythm. S1,S2 noted.  No murmur, rubs or gallops noted. No JVD or BLE edema. No carotid bruits noted. Pulmonary/Chest: Normal effort and positive vesicular breath sounds. No respiratory distress. No wheezes, rales or ronchi noted.  Musculoskeletal: No difficulty with gait.  Neurological: Alert and oriented. Cranial nerves II-XII grossly intact. Coordination normal.  Psychiatric: Mood and affect normal. Behavior is normal. Judgment and thought  content normal.     BMET    Component Value Date/Time   NA 132 (L) 07/12/2017 0342   K 3.8 07/12/2017 0342   CL 97 (L) 07/12/2017 0342   CO2 25 07/12/2017 0342   GLUCOSE 124 (H) 07/12/2017 0342   BUN 13 07/12/2017 0342   CREATININE 0.90 07/12/2017 0342   CALCIUM 8.3 (L) 07/12/2017 0342   GFRNONAA >60 07/12/2017 0342   GFRAA >60 07/12/2017 0342    Lipid Panel  No results found for: "CHOL", "TRIG", "HDL", "CHOLHDL", "VLDL", "LDLCALC"  CBC    Component Value Date/Time   WBC 9.1 07/14/2017 0350   RBC 3.89 (L) 07/14/2017 0350   HGB 12.0 (L) 07/14/2017 0350   HCT 35.1 (L) 07/14/2017 0350   PLT 224 07/14/2017 0350   MCV 90.2 07/14/2017 0350   MCH 30.7 07/14/2017 0350   MCHC 34.1 07/14/2017 0350   RDW 13.3 07/14/2017 0350   LYMPHSABS 1.7 07/03/2017 0933   MONOABS 0.7 07/03/2017 0933   EOSABS 0.1 07/03/2017 0933   BASOSABS 0.1 07/03/2017 0933    Hgb A1C Lab Results  Component Value Date   HGBA1C 5.3 07/03/2017     Assessment and Plan:

## 2022-09-21 ENCOUNTER — Encounter: Payer: Self-pay | Admitting: Internal Medicine

## 2022-09-21 ENCOUNTER — Ambulatory Visit (INDEPENDENT_AMBULATORY_CARE_PROVIDER_SITE_OTHER): Payer: BC Managed Care – PPO | Admitting: Internal Medicine

## 2022-09-21 VITALS — BP 136/82 | HR 61 | Temp 96.9°F | Wt 243.0 lb

## 2022-09-21 DIAGNOSIS — R7303 Prediabetes: Secondary | ICD-10-CM | POA: Insufficient documentation

## 2022-09-21 DIAGNOSIS — E7800 Pure hypercholesterolemia, unspecified: Secondary | ICD-10-CM | POA: Insufficient documentation

## 2022-09-21 DIAGNOSIS — Z024 Encounter for examination for driving license: Secondary | ICD-10-CM

## 2022-09-21 LAB — COMPLETE METABOLIC PANEL WITH GFR
AG Ratio: 1.4 (calc) (ref 1.0–2.5)
ALT: 18 U/L (ref 9–46)
AST: 17 U/L (ref 10–35)
Albumin: 4.3 g/dL (ref 3.6–5.1)
Alkaline phosphatase (APISO): 68 U/L (ref 35–144)
BUN: 13 mg/dL (ref 7–25)
CO2: 27 mmol/L (ref 20–32)
Calcium: 9.5 mg/dL (ref 8.6–10.3)
Chloride: 101 mmol/L (ref 98–110)
Creat: 0.96 mg/dL (ref 0.70–1.35)
Globulin: 3.1 g/dL (calc) (ref 1.9–3.7)
Glucose, Bld: 102 mg/dL — ABNORMAL HIGH (ref 65–99)
Potassium: 4.7 mmol/L (ref 3.5–5.3)
Sodium: 138 mmol/L (ref 135–146)
Total Bilirubin: 1.2 mg/dL (ref 0.2–1.2)
Total Protein: 7.4 g/dL (ref 6.1–8.1)
eGFR: 90 mL/min/{1.73_m2} (ref >=60)

## 2022-09-21 LAB — CBC
HCT: 45.6 % (ref 38.5–50.0)
Hemoglobin: 15 g/dL (ref 13.2–17.1)
MCHC: 32.9 g/dL (ref 32.0–36.0)
MCV: 90.7 fL (ref 80.0–100.0)
MPV: 11.2 fL (ref 7.5–12.5)
Platelets: 337 10*3/uL (ref 140–400)
RDW: 12.1 % (ref 11.0–15.0)

## 2022-09-21 LAB — URIC ACID: Uric Acid, Serum: 7.8 mg/dL (ref 4.0–8.0)

## 2022-09-21 LAB — HEMOGLOBIN A1C
Hgb A1c MFr Bld: 5.7 % of total Hgb — ABNORMAL HIGH (ref <5.7)
Mean Plasma Glucose: 117 mg/dL
eAG (mmol/L): 6.5 mmol/L

## 2022-09-21 LAB — LIPID PANEL
Cholesterol: 172 mg/dL (ref <200)
HDL: 42 mg/dL (ref >=40)
LDL Cholesterol (Calc): 116 mg/dL (calc) — ABNORMAL HIGH
Non-HDL Cholesterol (Calc): 130 mg/dL (calc) — ABNORMAL HIGH (ref <130)
Total CHOL/HDL Ratio: 4.1 (calc) (ref <5.0)
Triglycerides: 58 mg/dL (ref <150)

## 2022-09-21 LAB — HIV ANTIBODY (ROUTINE TESTING W REFLEX): HIV 1&2 Ab, 4th Generation: NONREACTIVE

## 2022-09-21 LAB — HEPATITIS C ANTIBODY: Hepatitis C Ab: NONREACTIVE

## 2022-09-21 NOTE — Progress Notes (Signed)
Commercial Driver Medical Examination   Cody Newman is a 62 y.o. male who presents today for a commercial driver fitness determination physical exam. The patient reports no problems. The following portions of the patient's history were reviewed and updated as appropriate: allergies, current medications, past family history, past medical history, past social history, past surgical history, and problem list. Review of Systems  Past Medical History:  Diagnosis Date   Gout     Current Outpatient Medications  Medication Sig Dispense Refill   olmesartan (BENICAR) 20 MG tablet Take 1 tablet (20 mg total) by mouth daily. 90 tablet 1   No current facility-administered medications for this visit.    No Known Allergies  Family History  Problem Relation Age of Onset   Cancer Mother        Breast   Cancer Father    Alcohol abuse Father    Heart disease Neg Hx     Social History   Socioeconomic History   Marital status: Married    Spouse name: Not on file   Number of children: Not on file   Years of education: Not on file   Highest education level: Not on file  Occupational History   Not on file  Tobacco Use   Smoking status: Never   Smokeless tobacco: Never  Vaping Use   Vaping Use: Never used  Substance and Sexual Activity   Alcohol use: Not Currently    Comment: Rare EtoH; tries to avoid due to gout.   Drug use: No   Sexual activity: Not on file  Other Topics Concern   Not on file  Social History Narrative   Not on file   Social Determinants of Health   Financial Resource Strain: Not on file  Food Insecurity: Not on file  Transportation Needs: Not on file  Physical Activity: Not on file  Stress: Not on file  Social Connections: Not on file  Intimate Partner Violence: Not on file     Constitutional: Denies fever, malaise, fatigue, headache or abrupt weight changes.  HEENT: Denies eye pain, eye redness, ear pain, ringing in the ears, wax buildup, runny nose, nasal  congestion, bloody nose, or sore throat. Respiratory: Denies difficulty breathing, shortness of breath, cough or sputum production.   Cardiovascular: Denies chest pain, chest tightness, palpitations or swelling in the hands or feet.  Gastrointestinal: Denies abdominal pain, bloating, constipation, diarrhea or blood in the stool.  GU: Denies urgency, frequency, pain with urination, burning sensation, blood in urine, odor or discharge. Musculoskeletal: Denies decrease in range of motion, difficulty with gait, muscle pain or joint pain and swelling.  Skin: Denies redness, rashes, lesions or ulcercations.  Neurological: Denies dizziness, difficulty with memory, difficulty with speech or problems with balance and coordination.  Psych: Denies anxiety, depression, SI/HI.  No other specific complaints in a complete review of systems (except as listed in HPI above).   Objective:    BP 136/82 (BP Location: Left Arm, Patient Position: Sitting, Cuff Size: Large)   Pulse 61   Temp (!) 96.9 F (36.1 C) (Temporal)   Wt 243 lb (110.2 kg)   SpO2 99%   BMI 32.96 kg/m   Vision:  Uncorrected Corrected Horizontal Field of Vision  Right Eye  20/15 70 degrees  Left Eye   20/15 70 degrees  Both Eyes   [id number]    Applicant can recognize and distinguish among traffic control signals and devices showing standard red, green, and amber colors.  Applicant meets visual acuity  requirement only when wearing corrective lenses.  Monocular Vision?: No   Hearing:        Right Ear  5 ft     Left Ear  5 ft       Wt Readings from Last 3 Encounters:  09/20/22 243 lb (110.2 kg)  06/22/22 243 lb 3.2 oz (110.3 kg)  06/30/20 253 lb 12.8 oz (115.1 kg)    General: Appears his stated age, obese, in NAD. Skin: Warm, dry and intact. No rashes, lesions or ulcerations noted. HEENT: Head: normal shape and size; Eyes: sclera white, no icterus, conjunctiva pink, PERRLA and EOMs intact; Ears: Tm's gray and intact, normal  light reflex; Nose: mucosa pink and moist, septum midline; Throat/Mouth: Teeth present, mucosa pink and moist, no exudate, lesions or ulcerations noted.  Neck:  Neck supple, trachea midline. No masses, lumps or thyromegaly present.  Cardiovascular: Normal rate and rhythm. S1,S2 noted.  No murmur, rubs or gallops noted. No JVD or BLE edema. No carotid bruits noted. Pulmonary/Chest: Normal effort and positive vesicular breath sounds. No respiratory distress. No wheezes, rales or ronchi noted.  Abdomen: Soft and nontender. Normal bowel sounds. No distention or masses noted. Liver, spleen and kidneys non palpable. Musculoskeletal: Normal range of motion. Strength 5/5 BUE/BLE. No difficulty with gait.  Neurological: Alert and oriented. Cranial nerves II-XII grossly intact. Coordination normal.  Psychiatric: Mood and affect normal. Behavior is normal. Judgment and thought content normal.     BMET    Component Value Date/Time   NA 138 09/20/2022 0932   K 4.7 09/20/2022 0932   CL 101 09/20/2022 0932   CO2 27 09/20/2022 0932   GLUCOSE 102 (H) 09/20/2022 0932   BUN 13 09/20/2022 0932   CREATININE 0.96 09/20/2022 0932   CALCIUM 9.5 09/20/2022 0932   GFRNONAA >60 07/12/2017 0342   GFRAA >60 07/12/2017 0342    Lipid Panel     Component Value Date/Time   CHOL 172 09/20/2022 0932   TRIG 58 09/20/2022 0932   HDL 42 09/20/2022 0932   CHOLHDL 4.1 09/20/2022 0932   LDLCALC 116 (H) 09/20/2022 0932    CBC    Component Value Date/Time   WBC 5.7 09/20/2022 0932   RBC 5.03 09/20/2022 0932   HGB 15.0 09/20/2022 0932   HCT 45.6 09/20/2022 0932   PLT 337 09/20/2022 0932   MCV 90.7 09/20/2022 0932   MCH 29.8 09/20/2022 0932   MCHC 32.9 09/20/2022 0932   RDW 12.1 09/20/2022 0932   LYMPHSABS 1.7 07/03/2017 0933   MONOABS 0.7 07/03/2017 0933   EOSABS 0.1 07/03/2017 0933   BASOSABS 0.1 07/03/2017 0933    Hgb A1C Lab Results  Component Value Date   HGBA1C 5.7 (H) 09/20/2022         Labs:  Urinalysis:    Specific Gravity: 1.010  Glucose: Negative  Protein: Negative  Blood: Negative  Assessment:    Healthy male exam.  Meets standards, but periodic monitoring required due to htn.  Driver qualified only for 1 year.    Plan:    Medical examiners certificate completed and printed. Return as needed.   Webb Silversmith, NP

## 2022-10-05 DIAGNOSIS — M9902 Segmental and somatic dysfunction of thoracic region: Secondary | ICD-10-CM | POA: Diagnosis not present

## 2022-10-05 DIAGNOSIS — M9903 Segmental and somatic dysfunction of lumbar region: Secondary | ICD-10-CM | POA: Diagnosis not present

## 2022-10-05 DIAGNOSIS — M9901 Segmental and somatic dysfunction of cervical region: Secondary | ICD-10-CM | POA: Diagnosis not present

## 2022-10-05 DIAGNOSIS — M5412 Radiculopathy, cervical region: Secondary | ICD-10-CM | POA: Diagnosis not present

## 2022-11-16 DIAGNOSIS — M9901 Segmental and somatic dysfunction of cervical region: Secondary | ICD-10-CM | POA: Diagnosis not present

## 2022-11-16 DIAGNOSIS — M9903 Segmental and somatic dysfunction of lumbar region: Secondary | ICD-10-CM | POA: Diagnosis not present

## 2022-11-16 DIAGNOSIS — M9902 Segmental and somatic dysfunction of thoracic region: Secondary | ICD-10-CM | POA: Diagnosis not present

## 2022-11-16 DIAGNOSIS — M5412 Radiculopathy, cervical region: Secondary | ICD-10-CM | POA: Diagnosis not present

## 2023-01-04 DIAGNOSIS — M5412 Radiculopathy, cervical region: Secondary | ICD-10-CM | POA: Diagnosis not present

## 2023-01-04 DIAGNOSIS — M9903 Segmental and somatic dysfunction of lumbar region: Secondary | ICD-10-CM | POA: Diagnosis not present

## 2023-01-04 DIAGNOSIS — M9901 Segmental and somatic dysfunction of cervical region: Secondary | ICD-10-CM | POA: Diagnosis not present

## 2023-01-04 DIAGNOSIS — M9902 Segmental and somatic dysfunction of thoracic region: Secondary | ICD-10-CM | POA: Diagnosis not present

## 2023-02-15 DIAGNOSIS — M9902 Segmental and somatic dysfunction of thoracic region: Secondary | ICD-10-CM | POA: Diagnosis not present

## 2023-02-15 DIAGNOSIS — M5412 Radiculopathy, cervical region: Secondary | ICD-10-CM | POA: Diagnosis not present

## 2023-02-15 DIAGNOSIS — M9901 Segmental and somatic dysfunction of cervical region: Secondary | ICD-10-CM | POA: Diagnosis not present

## 2023-02-15 DIAGNOSIS — M9903 Segmental and somatic dysfunction of lumbar region: Secondary | ICD-10-CM | POA: Diagnosis not present

## 2023-03-14 ENCOUNTER — Other Ambulatory Visit: Payer: Self-pay | Admitting: Internal Medicine

## 2023-03-15 NOTE — Telephone Encounter (Signed)
Requested Prescriptions  Pending Prescriptions Disp Refills   olmesartan (BENICAR) 20 MG tablet [Pharmacy Med Name: OLMESARTAN MEDOXOMIL 20MG  TABLETS] 90 tablet 0    Sig: TAKE 1 TABLET(20 MG) BY MOUTH DAILY     Cardiovascular:  Angiotensin Receptor Blockers Passed - 03/14/2023  3:16 AM      Passed - Cr in normal range and within 180 days    Creat  Date Value Ref Range Status  09/20/2022 0.96 0.70 - 1.35 mg/dL Final         Passed - K in normal range and within 180 days    Potassium  Date Value Ref Range Status  09/20/2022 4.7 3.5 - 5.3 mmol/L Final         Passed - Patient is not pregnant      Passed - Last BP in normal range    BP Readings from Last 1 Encounters:  09/21/22 136/82         Passed - Valid encounter within last 6 months    Recent Outpatient Visits           5 months ago Encounter for Department of Transportation (DOT) examination for driving license renewal   [facility] [place], Salvadore Oxford, NP   5 months ago Primary hypertension   [place] [facility] [place], Salvadore Oxford, NP   8 months ago Encounter for Department of Transportation (DOT) examination for driving license renewal   [place] [facility] [place], Salvadore Oxford, NP   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

## 2023-03-21 ENCOUNTER — Ambulatory Visit (INDEPENDENT_AMBULATORY_CARE_PROVIDER_SITE_OTHER): Payer: BC Managed Care – PPO | Admitting: Internal Medicine

## 2023-03-21 ENCOUNTER — Encounter: Payer: Self-pay | Admitting: Internal Medicine

## 2023-03-21 VITALS — BP 146/96 | HR 67 | Temp 95.9°F | Wt 241.0 lb

## 2023-03-21 DIAGNOSIS — E7800 Pure hypercholesterolemia, unspecified: Secondary | ICD-10-CM

## 2023-03-21 DIAGNOSIS — R252 Cramp and spasm: Secondary | ICD-10-CM | POA: Diagnosis not present

## 2023-03-21 DIAGNOSIS — R7303 Prediabetes: Secondary | ICD-10-CM

## 2023-03-21 DIAGNOSIS — Z0001 Encounter for general adult medical examination with abnormal findings: Secondary | ICD-10-CM | POA: Diagnosis not present

## 2023-03-21 DIAGNOSIS — E6609 Other obesity due to excess calories: Secondary | ICD-10-CM | POA: Diagnosis not present

## 2023-03-21 DIAGNOSIS — Z125 Encounter for screening for malignant neoplasm of prostate: Secondary | ICD-10-CM

## 2023-03-21 DIAGNOSIS — Z6832 Body mass index (BMI) 32.0-32.9, adult: Secondary | ICD-10-CM

## 2023-03-21 LAB — CBC
HCT: 46.3 % (ref 38.5–50.0)
Hemoglobin: 14.9 g/dL (ref 13.2–17.1)
MCH: 30 pg (ref 27.0–33.0)
MCHC: 32.2 g/dL (ref 32.0–36.0)
MCV: 93.2 fL (ref 80.0–100.0)
MPV: 11.1 fL (ref 7.5–12.5)
Platelets: 314 10*3/uL (ref 140–400)
RBC: 4.97 10*6/uL (ref 4.20–5.80)
RDW: 12.3 % (ref 11.0–15.0)
WBC: 5.9 10*3/uL (ref 3.8–10.8)

## 2023-03-21 NOTE — Progress Notes (Signed)
Subjective:    Patient ID: Cody Newman, male    DOB: 07/15/1960, 62 y.o.   MRN: [id number]  HPI  Patient presents to clinic today for his annual exam.  Of note, his BP today is 144/96.  He is prescribed olmesartan but admits that he often forgets to take this.  Flu: never Tetanus: > 10 years ago COVID: never Shingrix: never PSA screening: never Colon screening: never Vision screening: annually Dentist: annually  Diet: He does eat meat. He consumes fruits and veggies. He does eat some fried foods. He drinks mostly water, soda, gatorade. Exercise: None  Review of Systems     Past Medical History:  Diagnosis Date   Gout     Current Outpatient Medications  Medication Sig Dispense Refill   olmesartan (BENICAR) 20 MG tablet TAKE 1 TABLET(20 MG) BY MOUTH DAILY 90 tablet 0   No current facility-administered medications for this visit.    No Known Allergies  Family History  Problem Relation Age of Onset   Cancer Mother        Breast   Cancer Father    Alcohol abuse Father    Heart disease Neg Hx     Social History   Socioeconomic History   Marital status: Married    Spouse name: Not on file   Number of children: Not on file   Years of education: Not on file   Highest education level: Not on file  Occupational History   Not on file  Tobacco Use   Smoking status: Never   Smokeless tobacco: Never  Vaping Use   Vaping status: Never Used  Substance and Sexual Activity   Alcohol use: Not Currently    Comment: Rare EtoH; tries to avoid due to gout.   Drug use: No   Sexual activity: Not on file  Other Topics Concern   Not on file  Social History Narrative   Not on file   Social Determinants of Health   Financial Resource Strain: Not on file  Food Insecurity: Not on file  Transportation Needs: Not on file  Physical Activity: Not on file  Stress: Not on file  Social Connections: Not on file  Intimate Partner Violence: Not on file     Constitutional:  Denies fever, malaise, fatigue, headache or abrupt weight changes.  HEENT: Denies eye pain, eye redness, ear pain, ringing in the ears, wax buildup, runny nose, nasal congestion, bloody nose, or sore throat. Respiratory: Denies difficulty breathing, shortness of breath, cough or sputum production.   Cardiovascular: Denies chest pain, chest tightness, palpitations or swelling in the hands or feet.  Gastrointestinal: Denies abdominal pain, bloating, constipation, diarrhea or blood in the stool.  GU: Denies urgency, frequency, pain with urination, burning sensation, blood in urine, odor or discharge. Musculoskeletal: Patient reports nocturnal muscle cramps.  Denies decrease in range of motion, difficulty with gait, or joint pain and swelling.  Skin: Denies redness, rashes, lesions or ulcercations.  Neurological: Denies dizziness, difficulty with memory, difficulty with speech or problems with balance and coordination.  Psych: Denies anxiety, depression, SI/HI.  No other specific complaints in a complete review of systems (except as listed in HPI above).  Objective:   Physical Exam   BP (!) 146/96 (BP Location: Left Arm, Patient Position: Sitting, Cuff Size: Large)   Pulse 67   Temp (!) 95.9 F (35.5 C) (Temporal)   Wt 241 lb (109.3 kg)   SpO2 95%   BMI 32.69 kg/m   Wt Readings from  Last 3 Encounters:  09/21/22 243 lb (110.2 kg)  09/20/22 243 lb (110.2 kg)  06/22/22 243 lb 3.2 oz (110.3 kg)    General: Appears his stated age, obese, in NAD. Skin: Warm, dry and intact. HEENT: Head: normal shape and size; Eyes: sclera white, no icterus, conjunctiva pink, PERRLA and EOMs intact;  Neck:  Neck supple, trachea midline. No masses, lumps or thyromegaly present.  Cardiovascular: Normal rate and rhythm. S1,S2 noted.  No murmur, rubs or gallops noted. No JVD or BLE edema. No carotid bruits noted. Pulmonary/Chest: Normal effort and positive vesicular breath sounds. No respiratory distress. No  wheezes, rales or ronchi noted.  Abdomen: Soft and nontender. Normal bowel sounds.  Musculoskeletal: Strength 5/5 BUE/BLE.  No difficulty with gait.  Neurological: Alert and oriented. Cranial nerves II-XII grossly intact. Coordination normal.  Psychiatric: Mood and affect normal. Behavior is normal. Judgment and thought content normal.    BMET    Component Value Date/Time   NA 138 09/20/2022 0932   K 4.7 09/20/2022 0932   CL 101 09/20/2022 0932   CO2 27 09/20/2022 0932   GLUCOSE 102 (H) 09/20/2022 0932   BUN 13 09/20/2022 0932   CREATININE 0.96 09/20/2022 0932   CALCIUM 9.5 09/20/2022 0932   GFRNONAA >60 07/12/2017 0342   GFRAA >60 07/12/2017 0342    Lipid Panel     Component Value Date/Time   CHOL 172 09/20/2022 0932   TRIG 58 09/20/2022 0932   HDL 42 09/20/2022 0932   CHOLHDL 4.1 09/20/2022 0932   LDLCALC 116 (H) 09/20/2022 0932    CBC    Component Value Date/Time   WBC 5.7 09/20/2022 0932   RBC 5.03 09/20/2022 0932   HGB 15.0 09/20/2022 0932   HCT 45.6 09/20/2022 0932   PLT 337 09/20/2022 0932   MCV 90.7 09/20/2022 0932   MCH 29.8 09/20/2022 0932   MCHC 32.9 09/20/2022 0932   RDW 12.1 09/20/2022 0932   LYMPHSABS 1.7 07/03/2017 0933   MONOABS 0.7 07/03/2017 0933   EOSABS 0.1 07/03/2017 0933   BASOSABS 0.1 07/03/2017 0933    Hgb A1C Lab Results  Component Value Date   HGBA1C 5.7 (H) 09/20/2022           Assessment & Plan:   Preventative health maintenance:  Flu shot declined Tetanus declined Encouraged him to get his COVID-vaccine Discussed Shingrix vaccine, he will check coverage with his insurance company and schedule visit if he would like to have this done He declines referral to GI for screening colonoscopy or Cologuard Encouraged him to consume a balanced diet and exercise regimen Advised him to see an eye doctor and dentist annually We will check CBC, c-Met, lipid, A1c and PSA today  Nocturnal muscle cramps:  Encouraged adequate  hydration Will check c-Met and magnesium to rule out electrolyte imbalance  RTC in 6 months, follow-up chronic conditions Nicki Reaper, NP

## 2023-03-21 NOTE — Assessment & Plan Note (Signed)
Encourage diet and exercise for weight loss 

## 2023-03-21 NOTE — Patient Instructions (Signed)
Health Maintenance, Male Adopting a healthy lifestyle and getting preventive care are important in promoting health and wellness. Ask your health care provider about: The right schedule for you to have regular tests and exams. Things you can do on your own to prevent diseases and keep yourself healthy. What should I know about diet, weight, and exercise? Eat a healthy diet  Eat a diet that includes plenty of vegetables, fruits, low-fat dairy products, and lean protein. Do not eat a lot of foods that are high in solid fats, added sugars, or sodium. Maintain a healthy weight Body mass index (BMI) is a measurement that can be used to identify possible weight problems. It estimates body fat based on height and weight. Your health care provider can help determine your BMI and help you achieve or maintain a healthy weight. Get regular exercise Get regular exercise. This is one of the most important things you can do for your health. Most adults should: Exercise for at least 150 minutes each week. The exercise should increase your heart rate and make you sweat (moderate-intensity exercise). Do strengthening exercises at least twice a week. This is in addition to the moderate-intensity exercise. Spend less time sitting. Even light physical activity can be beneficial. Watch cholesterol and blood lipids Have your blood tested for lipids and cholesterol at 61 years of age, then have this test every 5 years. You may need to have your cholesterol levels checked more often if: Your lipid or cholesterol levels are high. You are older than 61 years of age. You are at high risk for heart disease. What should I know about cancer screening? Many types of cancers can be detected early and may often be prevented. Depending on your health history and family history, you may need to have cancer screening at various ages. This may include screening for: Colorectal cancer. Prostate cancer. Skin cancer. Lung  cancer. What should I know about heart disease, diabetes, and high blood pressure? Blood pressure and heart disease High blood pressure causes heart disease and increases the risk of stroke. This is more likely to develop in people who have high blood pressure readings or are overweight. Talk with your health care provider about your target blood pressure readings. Have your blood pressure checked: Every 3-5 years if you are 18-39 years of age. Every year if you are 40 years old or older. If you are between the ages of 65 and 75 and are a current or former smoker, ask your health care provider if you should have a one-time screening for abdominal aortic aneurysm (AAA). Diabetes Have regular diabetes screenings. This checks your fasting blood sugar level. Have the screening done: Once every three years after age 45 if you are at a normal weight and have a low risk for diabetes. More often and at a younger age if you are overweight or have a high risk for diabetes. What should I know about preventing infection? Hepatitis B If you have a higher risk for hepatitis B, you should be screened for this virus. Talk with your health care provider to find out if you are at risk for hepatitis B infection. Hepatitis C Blood testing is recommended for: Everyone born from 1945 through 1965. Anyone with known risk factors for hepatitis C. Sexually transmitted infections (STIs) You should be screened each year for STIs, including gonorrhea and chlamydia, if: You are sexually active and are younger than 61 years of age. You are older than 61 years of age and your   health care provider tells you that you are at risk for this type of infection. Your sexual activity has changed since you were last screened, and you are at increased risk for chlamydia or gonorrhea. Ask your health care provider if you are at risk. Ask your health care provider about whether you are at high risk for HIV. Your health care provider  may recommend a prescription medicine to help prevent HIV infection. If you choose to take medicine to prevent HIV, you should first get tested for HIV. You should then be tested every 3 months for as long as you are taking the medicine. Follow these instructions at home: Alcohol use Do not drink alcohol if your health care provider tells you not to drink. If you drink alcohol: Limit how much you have to 0-2 drinks a day. Know how much alcohol is in your drink. In the [place] one drink equals one 12 oz bottle of beer (355 mL), one 5 oz glass of wine (148 mL), or one 1 oz glass of hard liquor (44 mL). Lifestyle Do not use any products that contain nicotine or tobacco. These products include cigarettes, chewing tobacco, and vaping devices, such as e-cigarettes. If you need help quitting, ask your health care provider. Do not use street drugs. Do not share needles. Ask your health care provider for help if you need support or information about quitting drugs. General instructions Schedule regular health, dental, and eye exams. Stay current with your vaccines. Tell your health care provider if: You often feel depressed. You have ever been abused or do not feel safe at home. Summary Adopting a healthy lifestyle and getting preventive care are important in promoting health and wellness. Follow your health care provider's instructions about healthy diet, exercising, and getting tested or screened for diseases. Follow your health care provider's instructions on monitoring your cholesterol and blood pressure. This information is not intended to replace advice given to you by your health care provider. Make sure you discuss any questions you have with your health care provider. Document Revised: 10/31/2020 Document Reviewed: 10/31/2020 Elsevier Patient Education  2024 [employer]vier Inc.  

## 2023-03-22 LAB — HEMOGLOBIN A1C
Hgb A1c MFr Bld: 5.9 %{Hb} — ABNORMAL HIGH (ref <5.7)
Mean Plasma Glucose: 123 mg/dL
eAG (mmol/L): 6.8 mmol/L

## 2023-03-22 LAB — COMPLETE METABOLIC PANEL WITH GFR
AG Ratio: 1.4 (calc) (ref 1.0–2.5)
ALT: 13 U/L (ref 9–46)
AST: 16 U/L (ref 10–35)
Albumin: 4.1 g/dL (ref 3.6–5.1)
Alkaline phosphatase (APISO): 66 U/L (ref 35–144)
BUN: 15 mg/dL (ref 7–25)
CO2: 27 mmol/L (ref 20–32)
Calcium: 9.3 mg/dL (ref 8.6–10.3)
Chloride: 103 mmol/L (ref 98–110)
Creat: 0.98 mg/dL (ref 0.70–1.35)
Globulin: 3 g/dL (ref 1.9–3.7)
Glucose, Bld: 113 mg/dL — ABNORMAL HIGH (ref 65–99)
Potassium: 4.3 mmol/L (ref 3.5–5.3)
Sodium: 137 mmol/L (ref 135–146)
Total Bilirubin: 0.9 mg/dL (ref 0.2–1.2)
Total Protein: 7.1 g/dL (ref 6.1–8.1)
eGFR: 88 mL/min/{1.73_m2} (ref >=60)

## 2023-03-22 LAB — LIPID PANEL
Cholesterol: 172 mg/dL (ref <200)
HDL: 38 mg/dL — ABNORMAL LOW (ref >=40)
LDL Cholesterol (Calc): 118 mg/dL — ABNORMAL HIGH
Non-HDL Cholesterol (Calc): 134 mg/dL — ABNORMAL HIGH (ref <130)
Total CHOL/HDL Ratio: 4.5 (calc) (ref <5.0)
Triglycerides: 65 mg/dL (ref <150)

## 2023-03-22 LAB — PSA: PSA: 0.86 ng/mL (ref <=4.00)

## 2023-03-22 LAB — MAGNESIUM: Magnesium: 1.9 mg/dL (ref 1.5–2.5)

## 2023-03-29 DIAGNOSIS — M9902 Segmental and somatic dysfunction of thoracic region: Secondary | ICD-10-CM | POA: Diagnosis not present

## 2023-03-29 DIAGNOSIS — M9903 Segmental and somatic dysfunction of lumbar region: Secondary | ICD-10-CM | POA: Diagnosis not present

## 2023-03-29 DIAGNOSIS — M5412 Radiculopathy, cervical region: Secondary | ICD-10-CM | POA: Diagnosis not present

## 2023-03-29 DIAGNOSIS — M9901 Segmental and somatic dysfunction of cervical region: Secondary | ICD-10-CM | POA: Diagnosis not present

## 2023-04-01 MED ORDER — ATORVASTATIN CALCIUM 10 MG PO TABS: 10.0000 mg | ORAL_TABLET | Freq: Every day | ORAL | 1 refills | Status: DC

## 2023-04-01 NOTE — Addendum Note (Signed)
Addended by: Lorre Munroe on: 04/01/2023 12:54 PM   Modules accepted: Orders

## 2023-04-16 DIAGNOSIS — M9902 Segmental and somatic dysfunction of thoracic region: Secondary | ICD-10-CM | POA: Diagnosis not present

## 2023-04-16 DIAGNOSIS — M5412 Radiculopathy, cervical region: Secondary | ICD-10-CM | POA: Diagnosis not present

## 2023-04-16 DIAGNOSIS — M9901 Segmental and somatic dysfunction of cervical region: Secondary | ICD-10-CM | POA: Diagnosis not present

## 2023-04-16 DIAGNOSIS — M9903 Segmental and somatic dysfunction of lumbar region: Secondary | ICD-10-CM | POA: Diagnosis not present

## 2023-05-10 DIAGNOSIS — M9902 Segmental and somatic dysfunction of thoracic region: Secondary | ICD-10-CM | POA: Diagnosis not present

## 2023-05-10 DIAGNOSIS — M9901 Segmental and somatic dysfunction of cervical region: Secondary | ICD-10-CM | POA: Diagnosis not present

## 2023-05-10 DIAGNOSIS — M5412 Radiculopathy, cervical region: Secondary | ICD-10-CM | POA: Diagnosis not present

## 2023-05-10 DIAGNOSIS — M9903 Segmental and somatic dysfunction of lumbar region: Secondary | ICD-10-CM | POA: Diagnosis not present

## 2023-06-14 ENCOUNTER — Other Ambulatory Visit: Payer: Self-pay | Admitting: Internal Medicine

## 2023-06-14 NOTE — Telephone Encounter (Signed)
Requested Prescriptions  Pending Prescriptions Disp Refills   olmesartan (BENICAR) 20 MG tablet [Pharmacy Med Name: OLMESARTAN MEDOXOMIL 20MG  TABLETS] 90 tablet 0    Sig: TAKE 1 TABLET(20 MG) BY MOUTH DAILY     Cardiovascular:  Angiotensin Receptor Blockers Failed - 06/14/2023 12:28 PM      Failed - Last BP in normal range    BP Readings from Last 1 Encounters:  03/21/23 (!) 146/96         Passed - Cr in normal range and within 180 days    Creat  Date Value Ref Range Status  03/21/2023 0.98 0.70 - 1.35 mg/dL Final         Passed - K in normal range and within 180 days    Potassium  Date Value Ref Range Status  03/21/2023 4.3 3.5 - 5.3 mmol/L Final         Passed - Patient is not pregnant      Passed - Valid encounter within last 6 months    Recent Outpatient Visits           2 months ago Encounter for general adult medical examination with abnormal findings   [place] [facility] [place], Salvadore Oxford, NP   8 months ago Encounter for Department of Transportation (DOT) examination for driving license renewal   [facility] [place], Salvadore Oxford, NP   8 months ago Primary hypertension   [place] [facility] [place], Salvadore Oxford, NP   11 months ago Encounter for Department of Transportation (DOT) examination for driving license renewal   [place] [facility] [place], Salvadore Oxford, NP   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

## 2023-06-21 DIAGNOSIS — M9903 Segmental and somatic dysfunction of lumbar region: Secondary | ICD-10-CM | POA: Diagnosis not present

## 2023-06-21 DIAGNOSIS — M9901 Segmental and somatic dysfunction of cervical region: Secondary | ICD-10-CM | POA: Diagnosis not present

## 2023-06-21 DIAGNOSIS — M5412 Radiculopathy, cervical region: Secondary | ICD-10-CM | POA: Diagnosis not present

## 2023-06-21 DIAGNOSIS — M9902 Segmental and somatic dysfunction of thoracic region: Secondary | ICD-10-CM | POA: Diagnosis not present

## 2023-08-02 DIAGNOSIS — M9903 Segmental and somatic dysfunction of lumbar region: Secondary | ICD-10-CM | POA: Diagnosis not present

## 2023-08-02 DIAGNOSIS — M9901 Segmental and somatic dysfunction of cervical region: Secondary | ICD-10-CM | POA: Diagnosis not present

## 2023-08-02 DIAGNOSIS — M9902 Segmental and somatic dysfunction of thoracic region: Secondary | ICD-10-CM | POA: Diagnosis not present

## 2023-08-02 DIAGNOSIS — M5412 Radiculopathy, cervical region: Secondary | ICD-10-CM | POA: Diagnosis not present

## 2023-09-12 ENCOUNTER — Encounter: Payer: Self-pay | Admitting: Internal Medicine

## 2023-09-12 ENCOUNTER — Ambulatory Visit (INDEPENDENT_AMBULATORY_CARE_PROVIDER_SITE_OTHER): Payer: BC Managed Care – PPO | Admitting: Internal Medicine

## 2023-09-12 ENCOUNTER — Ambulatory Visit: Payer: BC Managed Care – PPO | Admitting: Internal Medicine

## 2023-09-12 VITALS — BP 138/82 | HR 60 | Ht 72.0 in | Wt 239.0 lb

## 2023-09-12 DIAGNOSIS — Z024 Encounter for examination for driving license: Secondary | ICD-10-CM

## 2023-09-12 DIAGNOSIS — E7800 Pure hypercholesterolemia, unspecified: Secondary | ICD-10-CM | POA: Diagnosis not present

## 2023-09-12 DIAGNOSIS — E6609 Other obesity due to excess calories: Secondary | ICD-10-CM

## 2023-09-12 DIAGNOSIS — R7303 Prediabetes: Secondary | ICD-10-CM | POA: Diagnosis not present

## 2023-09-12 DIAGNOSIS — M1A0790 Idiopathic chronic gout, unspecified ankle and foot, without tophus (tophi): Secondary | ICD-10-CM | POA: Diagnosis not present

## 2023-09-12 DIAGNOSIS — E66811 Obesity, class 1: Secondary | ICD-10-CM

## 2023-09-12 DIAGNOSIS — Z6832 Body mass index (BMI) 32.0-32.9, adult: Secondary | ICD-10-CM

## 2023-09-12 DIAGNOSIS — I10 Essential (primary) hypertension: Secondary | ICD-10-CM | POA: Diagnosis not present

## 2023-09-12 MED ORDER — OLMESARTAN MEDOXOMIL 20 MG PO TABS: 20.0000 mg | ORAL_TABLET | Freq: Every day | ORAL | 1 refills | Status: DC

## 2023-09-12 MED ORDER — INDOMETHACIN 50 MG PO CAPS: 50.0000 mg | ORAL_CAPSULE | Freq: Three times a day (TID) | ORAL | 0 refills | Status: DC

## 2023-09-12 NOTE — Assessment & Plan Note (Addendum)
 A1c today Encourage low-carb diet and exercise for weight loss

## 2023-09-12 NOTE — Progress Notes (Signed)
 Commercial Driver Medical Examination   Cody Newman is a 63 y.o. male who presents today for a commercial driver fitness determination physical exam. The patient reports no problems. The following portions of the patient's history were reviewed and updated as appropriate: allergies, current medications, past family history, past medical history, past social history, past surgical history, and problem list. Review of Systems  Past Medical History:  Diagnosis Date   Gout     Current Outpatient Medications  Medication Sig Dispense Refill   atorvastatin (LIPITOR) 10 MG tablet Take 1 tablet (10 mg total) by mouth daily. 90 tablet 1   olmesartan (BENICAR) 20 MG tablet TAKE 1 TABLET(20 MG) BY MOUTH DAILY 90 tablet 0   No current facility-administered medications for this visit.    No Known Allergies  Family History  Problem Relation Age of Onset   Cancer Mother        Breast   Cancer Father    Alcohol abuse Father    Heart disease Neg Hx     Social History   Socioeconomic History   Marital status: Married    Spouse name: Not on file   Number of children: Not on file   Years of education: Not on file   Highest education level: Not on file  Occupational History   Not on file  Tobacco Use   Smoking status: Never   Smokeless tobacco: Never  Vaping Use   Vaping status: Never Used  Substance and Sexual Activity   Alcohol use: Not Currently    Comment: Rare EtoH; tries to avoid due to gout.   Drug use: No   Sexual activity: Not on file  Other Topics Concern   Not on file  Social History Narrative   Not on file   Social Drivers of Health   Financial Resource Strain: Not on file  Food Insecurity: Not on file  Transportation Needs: Not on file  Physical Activity: Not on file  Stress: Not on file  Social Connections: Not on file  Intimate Partner Violence: Not on file     Constitutional: Denies fever, malaise, fatigue, headache or abrupt weight changes.  HEENT: Denies  eye pain, eye redness, ear pain, ringing in the ears, wax buildup, runny nose, nasal congestion, bloody nose, or sore throat. Respiratory: Denies difficulty breathing, shortness of breath, cough or sputum production.   Cardiovascular: Denies chest pain, chest tightness, palpitations or swelling in the hands or feet.  Gastrointestinal: Denies abdominal pain, bloating, constipation, diarrhea or blood in the stool.  GU: Denies urgency, frequency, pain with urination, burning sensation, blood in urine, odor or discharge. Musculoskeletal: Denies decrease in range of motion, difficulty with gait, muscle pain or joint pain and swelling.  Skin: Denies redness, rashes, lesions or ulcercations.  Neurological: Denies dizziness, difficulty with memory, difficulty with speech or problems with balance and coordination.  Psych: Denies anxiety, depression, SI/HI.  No other specific complaints in a complete review of systems (except as listed in HPI above).   Objective:    BP 138/82 (BP Location: Left Arm, Patient Position: Sitting, Cuff Size: Normal)   Pulse 60   Ht 6' (1.829 m)   Wt 239 lb (108.4 kg)   SpO2 98%   BMI 32.41 kg/m   Vision:  Uncorrected Corrected Horizontal Field of Vision  Right Eye  20/20 70 degrees  Left Eye   20/20 70 degrees  Both Eyes   20/20    Applicant can recognize and distinguish among traffic control signals  and devices showing standard red, green, and amber colors.  Applicant meets visual acuity requirement only when wearing corrective lenses.  Monocular Vision?: No   Hearing:        Right Ear  5 ft     Left Ear  5 ft       Wt Readings from Last 3 Encounters:  03/21/23 241 lb (109.3 kg)  09/21/22 243 lb (110.2 kg)  09/20/22 243 lb (110.2 kg)    General: Appears his stated age, obese, in NAD. Skin: Warm, dry and intact. No rashes, lesions or ulcerations noted. HEENT: Head: normal shape and size; Eyes: sclera white, no icterus, conjunctiva pink, PERRLA and  EOMs intact; Ears: Tm's gray and intact, normal light reflex; Nose: mucosa pink and moist, septum midline; Throat/Mouth: Teeth present, mucosa pink and moist, no exudate, lesions or ulcerations noted.  Neck:  Neck supple, trachea midline. No masses, lumps or thyromegaly present.  Cardiovascular: Normal rate and rhythm. S1,S2 noted.  No murmur, rubs or gallops noted. No JVD or BLE edema. No carotid bruits noted. Pulmonary/Chest: Normal effort and positive vesicular breath sounds. No respiratory distress. No wheezes, rales or ronchi noted.  Abdomen: Soft and nontender. Normal bowel sounds. No distention or masses noted. Liver, spleen and kidneys non palpable. Musculoskeletal: Normal range of motion. Strength 5/5 BUE/BLE. No difficulty with gait.  Neurological: Alert and oriented. Cranial nerves II-XII grossly intact. Coordination normal.  Psychiatric: Mood and affect normal. Behavior is normal. Judgment and thought content normal.     BMET    Component Value Date/Time   NA 137 03/21/2023 0830   K 4.3 03/21/2023 0830   CL 103 03/21/2023 0830   CO2 27 03/21/2023 0830   GLUCOSE 113 (H) 03/21/2023 0830   BUN 15 03/21/2023 0830   CREATININE 0.98 03/21/2023 0830   CALCIUM 9.3 03/21/2023 0830   GFRNONAA >60 07/12/2017 0342   GFRAA >60 07/12/2017 0342    Lipid Panel     Component Value Date/Time   CHOL 172 03/21/2023 0830   TRIG 65 03/21/2023 0830   HDL 38 (L) 03/21/2023 0830   CHOLHDL 4.5 03/21/2023 0830   LDLCALC 118 (H) 03/21/2023 0830    CBC    Component Value Date/Time   WBC 5.9 03/21/2023 0830   RBC 4.97 03/21/2023 0830   HGB 14.9 03/21/2023 0830   HCT 46.3 03/21/2023 0830   PLT 314 03/21/2023 0830   MCV 93.2 03/21/2023 0830   MCH 30.0 03/21/2023 0830   MCHC 32.2 03/21/2023 0830   RDW 12.3 03/21/2023 0830   LYMPHSABS 1.7 07/03/2017 0933   MONOABS 0.7 07/03/2017 0933   EOSABS 0.1 07/03/2017 0933   BASOSABS 0.1 07/03/2017 0933    Hgb A1C Lab Results  Component Value  Date   HGBA1C 5.9 (H) 03/21/2023        Labs:  Urinalysis:    Specific Gravity: 1.005  Glucose: Negative  Protein: Negative  Blood: Negative  Assessment:    Healthy male exam.  Meets standards, but periodic monitoring required due to htn.  Driver qualified only for 1 year.    Plan:    Medical examiners certificate completed and printed. Return as needed.   Nicki Reaper, NP

## 2023-09-12 NOTE — Assessment & Plan Note (Signed)
 Will check uric acid level today Encourage low purine diet Rx for indomethacin 50 mg 3 times daily.

## 2023-09-12 NOTE — Progress Notes (Signed)
 HPI  Patient presents to clinic today for follow-up of chronic conditions.  HTN: His BP today is 150/98. He is taking olmesartan as prescribed. ECG from 05/2017 reviewed.  Gout: He reports recent flare in his right knee. He is not currently taking any medication for this but has been on indomethacin and allopurinol in the past.  He does not follow with rheumatology.  HLD: His last LDL was 119, triglycerides 65, 02/2023.  He denies myalgias on atorvastatin.  He tries to consume a low-fat diet.  Prediabetes: His last A1c was 5.9%, 02/2023.  He is not taking any oral diabetic medication at this time.  He does not check his sugars.   Past Medical History:  Diagnosis Date   Gout     Current Outpatient Medications  Medication Sig Dispense Refill   atorvastatin (LIPITOR) 10 MG tablet Take 1 tablet (10 mg total) by mouth daily. 90 tablet 1   olmesartan (BENICAR) 20 MG tablet TAKE 1 TABLET(20 MG) BY MOUTH DAILY 90 tablet 0   No current facility-administered medications for this visit.    No Known Allergies  Family History  Problem Relation Age of Onset   Cancer Mother        Breast   Cancer Father    Alcohol abuse Father    Heart disease Neg Hx     Social History   Socioeconomic History   Marital status: Married    Spouse name: Not on file   Number of children: Not on file   Years of education: Not on file   Highest education level: Not on file  Occupational History   Not on file  Tobacco Use   Smoking status: Never   Smokeless tobacco: Never  Vaping Use   Vaping status: Never Used  Substance and Sexual Activity   Alcohol use: Not Currently    Comment: Rare EtoH; tries to avoid due to gout.   Drug use: No   Sexual activity: Not on file  Other Topics Concern   Not on file  Social History Narrative   Not on file   Social Drivers of Health   Financial Resource Strain: Not on file  Food Insecurity: Not on file  Transportation Needs: Not on file  Physical Activity:  Not on file  Stress: Not on file  Social Connections: Not on file  Intimate Partner Violence: Not on file    ROS:  Constitutional: Denies fever, malaise, fatigue, headache or abrupt weight changes.  HEENT: Denies eye pain, eye redness, ear pain, ringing in the ears, wax buildup, runny nose, nasal congestion, bloody nose, or sore throat. Respiratory: Denies difficulty breathing, shortness of breath, cough or sputum production.   Cardiovascular: Denies chest pain, chest tightness, palpitations or swelling in the hands or feet.  Gastrointestinal: Denies abdominal pain, bloating, constipation, diarrhea or blood in the stool.  GU: Denies frequency, urgency, pain with urination, blood in urine, odor or discharge. Musculoskeletal: Patient reports right knee pain.  Denies decrease in range of motion, difficulty with gait, muscle pain or joint swelling.  Skin: Denies redness, rashes, lesions or ulcercations.  Neurological: Denies dizziness, difficulty with memory, difficulty with speech or problems with balance and coordination.  Psych: Denies anxiety, depression, SI/HI.  No other specific complaints in a complete review of systems (except as listed in HPI above).  PE: BP 138/82 (BP Location: Left Arm, Patient Position: Sitting, Cuff Size: Normal)   Pulse 60   Ht 6' (1.829 m)   Wt 239 lb (108.4  kg)   SpO2 98%   BMI 32.41 kg/m   Wt Readings from Last 3 Encounters:  03/21/23 241 lb (109.3 kg)  09/21/22 243 lb (110.2 kg)  09/20/22 243 lb (110.2 kg)    General: Appears his stated age, obese, in NAD. HEENT: Head: normal shape and size; Eyes: sclera white, no icterus, conjunctiva pink, PERRLA and EOMs intact;  Cardiovascular: Normal rate and rhythm. S1,S2 noted.  No murmur, rubs or gallops noted. No JVD or BLE edema. No carotid bruits noted. Pulmonary/Chest: Normal effort and positive vesicular breath sounds. No respiratory distress. No wheezes, rales or ronchi noted.  Musculoskeletal: Joint  enlargement noted of the right knee.  No difficulty with gait.  Neurological: Alert and oriented. Cranial nerves II-XII grossly intact. Coordination normal.     BMET    Component Value Date/Time   NA 137 03/21/2023 0830   K 4.3 03/21/2023 0830   CL 103 03/21/2023 0830   CO2 27 03/21/2023 0830   GLUCOSE 113 (H) 03/21/2023 0830   BUN 15 03/21/2023 0830   CREATININE 0.98 03/21/2023 0830   CALCIUM 9.3 03/21/2023 0830   GFRNONAA >60 07/12/2017 0342   GFRAA >60 07/12/2017 0342    Lipid Panel     Component Value Date/Time   CHOL 172 03/21/2023 0830   TRIG 65 03/21/2023 0830   HDL 38 (L) 03/21/2023 0830   CHOLHDL 4.5 03/21/2023 0830   LDLCALC 118 (H) 03/21/2023 0830    CBC    Component Value Date/Time   WBC 5.9 03/21/2023 0830   RBC 4.97 03/21/2023 0830   HGB 14.9 03/21/2023 0830   HCT 46.3 03/21/2023 0830   PLT 314 03/21/2023 0830   MCV 93.2 03/21/2023 0830   MCH 30.0 03/21/2023 0830   MCHC 32.2 03/21/2023 0830   RDW 12.3 03/21/2023 0830   LYMPHSABS 1.7 07/03/2017 0933   MONOABS 0.7 07/03/2017 0933   EOSABS 0.1 07/03/2017 0933   BASOSABS 0.1 07/03/2017 0933    Hgb A1C Lab Results  Component Value Date   HGBA1C 5.9 (H) 03/21/2023     Assessment and Plan:  RTC in 6 months for your annual exam Nicki Reaper, NP

## 2023-09-12 NOTE — Assessment & Plan Note (Signed)
Controlled on olmesartan Reinforced DASH diet and exercise for weight loss C-Met today 

## 2023-09-12 NOTE — Patient Instructions (Signed)

## 2023-09-12 NOTE — Assessment & Plan Note (Signed)
 Encourage diet and exercise for weight loss

## 2023-09-12 NOTE — Assessment & Plan Note (Signed)
 C-Met and lipid profile today Encouraged her to consume a low-fat diet Will adjust atorvastatin needed based on labs

## 2023-09-13 DIAGNOSIS — M9902 Segmental and somatic dysfunction of thoracic region: Secondary | ICD-10-CM | POA: Diagnosis not present

## 2023-09-13 DIAGNOSIS — M9903 Segmental and somatic dysfunction of lumbar region: Secondary | ICD-10-CM | POA: Diagnosis not present

## 2023-09-13 DIAGNOSIS — M9901 Segmental and somatic dysfunction of cervical region: Secondary | ICD-10-CM | POA: Diagnosis not present

## 2023-09-13 DIAGNOSIS — M5412 Radiculopathy, cervical region: Secondary | ICD-10-CM | POA: Diagnosis not present

## 2023-09-13 LAB — COMPLETE METABOLIC PANEL WITH GFR
AG Ratio: 1.3 (calc) (ref 1.0–2.5)
ALT: 16 U/L (ref 9–46)
AST: 14 U/L (ref 10–35)
Albumin: 4.1 g/dL (ref 3.6–5.1)
Alkaline phosphatase (APISO): 77 U/L (ref 35–144)
BUN: 10 mg/dL (ref 7–25)
CO2: 27 mmol/L (ref 20–32)
Calcium: 9.4 mg/dL (ref 8.6–10.3)
Chloride: 103 mmol/L (ref 98–110)
Creat: 0.9 mg/dL (ref 0.70–1.35)
Globulin: 3.1 g/dL (ref 1.9–3.7)
Glucose, Bld: 98 mg/dL (ref 65–99)
Potassium: 4.3 mmol/L (ref 3.5–5.3)
Sodium: 138 mmol/L (ref 135–146)
Total Bilirubin: 1.6 mg/dL — ABNORMAL HIGH (ref 0.2–1.2)
Total Protein: 7.2 g/dL (ref 6.1–8.1)

## 2023-09-13 LAB — HEMOGLOBIN A1C
Hgb A1c MFr Bld: 5.8 %{Hb} — ABNORMAL HIGH (ref <5.7)
Mean Plasma Glucose: 120 mg/dL
eAG (mmol/L): 6.6 mmol/L

## 2023-09-13 LAB — CBC
HCT: 44.7 % (ref 38.5–50.0)
Hemoglobin: 14.8 g/dL (ref 13.2–17.1)
MCH: 30.2 pg (ref 27.0–33.0)
MCHC: 33.1 g/dL (ref 32.0–36.0)
MCV: 91.2 fL (ref 80.0–100.0)
MPV: 11.2 fL (ref 7.5–12.5)
Platelets: 316 10*3/uL (ref 140–400)
RBC: 4.9 10*6/uL (ref 4.20–5.80)
RDW: 12 % (ref 11.0–15.0)
WBC: 6.7 10*3/uL (ref 3.8–10.8)

## 2023-09-13 LAB — URIC ACID: Uric Acid, Serum: 7.6 mg/dL (ref 4.0–8.0)

## 2023-09-13 LAB — LIPID PANEL
Cholesterol: 129 mg/dL (ref <200)
HDL: 39 mg/dL — ABNORMAL LOW (ref >=40)
LDL Cholesterol (Calc): 76 mg/dL
Non-HDL Cholesterol (Calc): 90 mg/dL (ref <130)
Total CHOL/HDL Ratio: 3.3 (calc) (ref <5.0)
Triglycerides: 55 mg/dL (ref <150)

## 2023-09-20 ENCOUNTER — Other Ambulatory Visit: Payer: Self-pay | Admitting: Internal Medicine

## 2023-09-23 NOTE — Telephone Encounter (Signed)
 Refilled 09/12/23. Requested Prescriptions  Refused Prescriptions Disp Refills   indomethacin (INDOCIN) 50 MG capsule [Pharmacy Med Name: INDOMETHACIN 50MG  CAPSULES] 30 capsule 0    Sig: TAKE 1 CAPSULE(50 MG) BY MOUTH THREE TIMES DAILY WITH MEALS     Analgesics:  NSAIDS Failed - 09/23/2023  8:47 AM      Failed - Manual Review: Labs are only required if the patient has taken medication for more than 8 weeks.      Passed - Cr in normal range and within 360 days    Creat  Date Value Ref Range Status  09/12/2023 0.90 0.70 - 1.35 mg/dL Final         Passed - HGB in normal range and within 360 days    Hemoglobin  Date Value Ref Range Status  09/12/2023 14.8 13.2 - 17.1 g/dL Final         Passed - PLT in normal range and within 360 days    Platelets  Date Value Ref Range Status  09/12/2023 316 140 - 400 Thousand/uL Final         Passed - HCT in normal range and within 360 days    HCT  Date Value Ref Range Status  09/12/2023 44.7 38.5 - 50.0 % Final         Passed - eGFR is 30 or above and within 360 days    GFR calc Af Amer  Date Value Ref Range Status  07/12/2017 >60 >60 mL/min Final    Comment:    (NOTE) The eGFR has been calculated using the CKD EPI equation. This calculation has not been validated in all clinical situations. eGFR's persistently <60 mL/min signify possible Chronic Kidney Disease.    GFR calc non Af Amer  Date Value Ref Range Status  07/12/2017 >60 >60 mL/min Final   eGFR  Date Value Ref Range Status  03/21/2023 88 > OR = 60 mL/min/1.69m2 Final         Passed - Patient is not pregnant      Passed - Valid encounter within last 12 months    Recent Outpatient Visits           1 week ago Encounter for Department of Transportation (DOT) examination for driving license renewal   [facility] [place], Salvadore Oxford, NP   1 week ago Pure hypercholesterolemia   [place] [facility] [place], Salvadore Oxford, [place]

## 2023-10-04 DIAGNOSIS — M9903 Segmental and somatic dysfunction of lumbar region: Secondary | ICD-10-CM | POA: Diagnosis not present

## 2023-10-04 DIAGNOSIS — M9901 Segmental and somatic dysfunction of cervical region: Secondary | ICD-10-CM | POA: Diagnosis not present

## 2023-10-04 DIAGNOSIS — M5412 Radiculopathy, cervical region: Secondary | ICD-10-CM | POA: Diagnosis not present

## 2023-10-04 DIAGNOSIS — M9902 Segmental and somatic dysfunction of thoracic region: Secondary | ICD-10-CM | POA: Diagnosis not present

## 2023-10-06 ENCOUNTER — Other Ambulatory Visit: Payer: Self-pay | Admitting: Internal Medicine

## 2023-10-07 NOTE — Telephone Encounter (Signed)
 Requested Prescriptions  Pending Prescriptions Disp Refills   atorvastatin (LIPITOR) 10 MG tablet [Pharmacy Med Name: ATORVASTATIN 10MG  TABLETS] 90 tablet 1    Sig: TAKE 1 TABLET(10 MG) BY MOUTH DAILY     Cardiovascular:  Antilipid - Statins Failed - 10/07/2023  5:04 PM      Failed - Lipid Panel in normal range within the last 12 months    Cholesterol  Date Value Ref Range Status  09/12/2023 129 <200 mg/dL Final   LDL Cholesterol (Calc)  Date Value Ref Range Status  09/12/2023 76 mg/dL (calc) Final    Comment:    Reference range: <100 . Desirable range <100 mg/dL for primary prevention;   <70 mg/dL for patients with CHD or diabetic patients  with > or = 2 CHD risk factors. Aaron Aas LDL-C is now calculated using the Martin-Hopkins  calculation, which is a validated novel method providing  better accuracy than the Friedewald equation in the  estimation of LDL-C.  Melinda Sprawls et al. Erroll Heard. 1610;960(45): 2061-2068  (http://education.[facility].com/faq/FAQ164)    HDL  Date Value Ref Range Status  09/12/2023 39 (L) > OR = 40 mg/dL Final   Triglycerides  Date Value Ref Range Status  09/12/2023 55 <150 mg/dL Final         Passed - Patient is not pregnant      Passed - Valid encounter within last 12 months    Recent Outpatient Visits           3 weeks ago Encounter for Department of Transportation (DOT) examination for driving license renewal   [facility], Rankin Buzzard, NP   3 weeks ago Pure hypercholesterolemia   [place] [facility] [place], Rankin Buzzard, [place]

## 2023-10-09 DIAGNOSIS — M9901 Segmental and somatic dysfunction of cervical region: Secondary | ICD-10-CM | POA: Diagnosis not present

## 2023-10-09 DIAGNOSIS — M9903 Segmental and somatic dysfunction of lumbar region: Secondary | ICD-10-CM | POA: Diagnosis not present

## 2023-10-09 DIAGNOSIS — M9902 Segmental and somatic dysfunction of thoracic region: Secondary | ICD-10-CM | POA: Diagnosis not present

## 2023-10-09 DIAGNOSIS — M5412 Radiculopathy, cervical region: Secondary | ICD-10-CM | POA: Diagnosis not present

## 2024-02-21 DIAGNOSIS — M5412 Radiculopathy, cervical region: Secondary | ICD-10-CM | POA: Diagnosis not present

## 2024-02-21 DIAGNOSIS — M9903 Segmental and somatic dysfunction of lumbar region: Secondary | ICD-10-CM | POA: Diagnosis not present

## 2024-02-21 DIAGNOSIS — M9901 Segmental and somatic dysfunction of cervical region: Secondary | ICD-10-CM | POA: Diagnosis not present

## 2024-02-21 DIAGNOSIS — M9902 Segmental and somatic dysfunction of thoracic region: Secondary | ICD-10-CM | POA: Diagnosis not present

## 2024-03-25 ENCOUNTER — Encounter: Payer: Self-pay | Admitting: Internal Medicine

## 2024-03-25 ENCOUNTER — Ambulatory Visit (INDEPENDENT_AMBULATORY_CARE_PROVIDER_SITE_OTHER): Admitting: Internal Medicine

## 2024-03-25 VITALS — BP 152/98 | Ht 72.0 in | Wt 243.0 lb

## 2024-03-25 DIAGNOSIS — Z125 Encounter for screening for malignant neoplasm of prostate: Secondary | ICD-10-CM

## 2024-03-25 DIAGNOSIS — Z6832 Body mass index (BMI) 32.0-32.9, adult: Secondary | ICD-10-CM

## 2024-03-25 DIAGNOSIS — Z0001 Encounter for general adult medical examination with abnormal findings: Secondary | ICD-10-CM | POA: Diagnosis not present

## 2024-03-25 DIAGNOSIS — I10 Essential (primary) hypertension: Secondary | ICD-10-CM

## 2024-03-25 DIAGNOSIS — E66811 Obesity, class 1: Secondary | ICD-10-CM | POA: Diagnosis not present

## 2024-03-25 DIAGNOSIS — E7800 Pure hypercholesterolemia, unspecified: Secondary | ICD-10-CM

## 2024-03-25 DIAGNOSIS — R7303 Prediabetes: Secondary | ICD-10-CM | POA: Diagnosis not present

## 2024-03-25 DIAGNOSIS — E6609 Other obesity due to excess calories: Secondary | ICD-10-CM

## 2024-03-25 MED ORDER — INDOMETHACIN 50 MG PO CAPS: 50.0000 mg | ORAL_CAPSULE | Freq: Three times a day (TID) | ORAL | 0 refills | Status: AC

## 2024-03-25 MED ORDER — OLMESARTAN MEDOXOMIL 40 MG PO TABS: 40.0000 mg | ORAL_TABLET | Freq: Every day | ORAL | 1 refills | Status: AC

## 2024-03-25 NOTE — Progress Notes (Signed)
 Subjective:    Patient ID: Cody Newman, male    DOB: Dec 12, 1960, 63 y.o.   MRN: [id number]  HPI  Patient presents to clinic today for his annual exam.  Of note, his BP today is 150/94.  He has been taking olmesartan  20 mg daily as prescribed.  Flu: never Tetanus: > 10 years ago COVID: never Shingrix: never PSA screening: 02/2023 Colon screening: never Vision screening: annually Dentist: annually  Diet: He does eat meat. He consumes fruits and veggies. He does eat some fried foods. He drinks mostly water, soda, gatorade. Exercise: None  Review of Systems     Past Medical History:  Diagnosis Date   Gout     Current Outpatient Medications  Medication Sig Dispense Refill   atorvastatin  (LIPITOR) 10 MG tablet TAKE 1 TABLET(10 MG) BY MOUTH DAILY 90 tablet 1   indomethacin  (INDOCIN ) 50 MG capsule Take 1 capsule (50 mg total) by mouth 3 (three) times daily with meals. 30 capsule 0   olmesartan  (BENICAR ) 20 MG tablet Take 1 tablet (20 mg total) by mouth daily. 90 tablet 1   No current facility-administered medications for this visit.    No Known Allergies  Family History  Problem Relation Age of Onset   Cancer Mother        Breast   Cancer Father    Alcohol abuse Father    Heart disease Neg Hx     Social History   Socioeconomic History   Marital status: Married    Spouse name: Not on file   Number of children: Not on file   Years of education: Not on file   Highest education level: Not on file  Occupational History   Not on file  Tobacco Use   Smoking status: Never   Smokeless tobacco: Never  Vaping Use   Vaping status: Never Used  Substance and Sexual Activity   Alcohol use: Not Currently    Comment: Rare EtoH; tries to avoid due to gout.   Drug use: No   Sexual activity: Not on file  Other Topics Concern   Not on file  Social History Narrative   Not on file   Social Drivers of Health   Financial Resource Strain: Not on file  Food Insecurity: Not on  file  Transportation Needs: Not on file  Physical Activity: Not on file  Stress: Not on file  Social Connections: Not on file  Intimate Partner Violence: Not on file     Constitutional: Denies fever, malaise, fatigue, headache or abrupt weight changes.  HEENT: Denies eye pain, eye redness, ear pain, ringing in the ears, wax buildup, runny nose, nasal congestion, bloody nose, or sore throat. Respiratory: Denies difficulty breathing, shortness of breath, cough or sputum production.   Cardiovascular: Denies chest pain, chest tightness, palpitations or swelling in the hands or feet.  Gastrointestinal: Denies abdominal pain, bloating, constipation, diarrhea or blood in the stool.  GU: Denies urgency, frequency, pain with urination, burning sensation, blood in urine, odor or discharge. Musculoskeletal: Pt reports intermittent joint pain and swelling in knees. Denies decrease in range of motion, difficulty with gait.  Skin: Denies redness, rashes, lesions or ulcercations.  Neurological: Denies dizziness, difficulty with memory, difficulty with speech or problems with balance and coordination.  Psych: Denies anxiety, depression, SI/HI.  No other specific complaints in a complete review of systems (except as listed in HPI above).  Objective:   Physical Exam BP (!) 150/94 (BP Location: Left Arm, Patient Position: Sitting, Cuff  Size: Large)   Ht 6' (1.829 m)   Wt 243 lb (110.2 kg)   BMI 32.96 kg/m    Wt Readings from Last 3 Encounters:  09/12/23 239 lb (108.4 kg)  09/12/23 239 lb (108.4 kg)  03/21/23 241 lb (109.3 kg)    General: Appears his stated age, obese, in NAD. Skin: Warm, dry and intact. HEENT: Head: normal shape and size; Eyes: sclera white, no icterus, conjunctiva pink, PERRLA and EOMs intact;  Neck:  Neck supple, trachea midline. No masses, lumps or thyromegaly present.  Cardiovascular: Normal rate and rhythm. S1,S2 noted.  No murmur, rubs or gallops noted. No JVD or BLE  edema. No carotid bruits noted. Pulmonary/Chest: Normal effort and positive vesicular breath sounds. No respiratory distress. No wheezes, rales or ronchi noted.  Abdomen: Soft and nontender. Normal bowel sounds.  Musculoskeletal: Strength 5/5 BUE/BLE.  No difficulty with gait.  Neurological: Alert and oriented. Cranial nerves II-XII grossly intact. Coordination normal.  Psychiatric: Mood and affect normal. Behavior is normal. Judgment and thought content normal.    BMET    Component Value Date/Time   NA 138 09/12/2023 0849   K 4.3 09/12/2023 0849   CL 103 09/12/2023 0849   CO2 27 09/12/2023 0849   GLUCOSE 98 09/12/2023 0849   BUN 10 09/12/2023 0849   CREATININE 0.90 09/12/2023 0849   CALCIUM  9.4 09/12/2023 0849   GFRNONAA >60 07/12/2017 0342   GFRAA >60 07/12/2017 0342    Lipid Panel     Component Value Date/Time   CHOL 129 09/12/2023 0849   TRIG 55 09/12/2023 0849   HDL 39 (L) 09/12/2023 0849   CHOLHDL 3.3 09/12/2023 0849   LDLCALC 76 09/12/2023 0849    CBC    Component Value Date/Time   WBC 6.7 09/12/2023 0849   RBC 4.90 09/12/2023 0849   HGB 14.8 09/12/2023 0849   HCT 44.7 09/12/2023 0849   PLT 316 09/12/2023 0849   MCV 91.2 09/12/2023 0849   MCH 30.2 09/12/2023 0849   MCHC 33.1 09/12/2023 0849   RDW 12.0 09/12/2023 0849   LYMPHSABS 1.7 07/03/2017 0933   MONOABS 0.7 07/03/2017 0933   EOSABS 0.1 07/03/2017 0933   BASOSABS 0.1 07/03/2017 0933    Hgb A1C Lab Results  Component Value Date   HGBA1C 5.8 (H) 09/12/2023           Assessment & Plan:   Preventative health maintenance:  Flu shot declined Tetanus declined Encouraged him to get his COVID-vaccine Discussed Shingrix vaccine, he will check coverage with his insurance company and schedule visit if he would like to have this done He declines referral to GI for screening colonoscopy or Cologuard Encouraged him to consume a balanced diet and exercise regimen Advised him to see an eye doctor and  dentist annually We will check CBC, c-Met, lipid, A1c, and PSA today   RTC in 6 months, follow-up chronic conditions Angeline Laura, NP

## 2024-03-25 NOTE — Patient Instructions (Signed)
 Health Maintenance, Male  Adopting a healthy lifestyle and getting preventive care are important in promoting health and wellness. Ask your health care provider about:  The right schedule for you to have regular tests and exams.  Things you can do on your own to prevent diseases and keep yourself healthy.  What should I know about diet, weight, and exercise?  Eat a healthy diet    Eat a diet that includes plenty of vegetables, fruits, low-fat dairy products, and lean protein.  Do not eat a lot of foods that are high in solid fats, added sugars, or sodium.  Maintain a healthy weight  Body mass index (BMI) is a measurement that can be used to identify possible weight problems. It estimates body fat based on height and weight. Your health care provider can help determine your BMI and help you achieve or maintain a healthy weight.  Get regular exercise  Get regular exercise. This is one of the most important things you can do for your health. Most adults should:  Exercise for at least 150 minutes each week. The exercise should increase your heart rate and make you sweat (moderate-intensity exercise).  Do strengthening exercises at least twice a week. This is in addition to the moderate-intensity exercise.  Spend less time sitting. Even light physical activity can be beneficial.  Watch cholesterol and blood lipids  Have your blood tested for lipids and cholesterol at 62 years of age, then have this test every 5 years.  You may need to have your cholesterol levels checked more often if:  Your lipid or cholesterol levels are high.  You are older than 62 years of age.  You are at high risk for heart disease.  What should I know about cancer screening?  Many types of cancers can be detected early and may often be prevented. Depending on your health history and family history, you may need to have cancer screening at various ages. This may include screening for:  Colorectal cancer.  Prostate cancer.  Skin cancer.  Lung  cancer.  What should I know about heart disease, diabetes, and high blood pressure?  Blood pressure and heart disease  High blood pressure causes heart disease and increases the risk of stroke. This is more likely to develop in people who have high blood pressure readings or are overweight.  Talk with your health care provider about your target blood pressure readings.  Have your blood pressure checked:  Every 3-5 years if you are 24-52 years of age.  Every year if you are 3 years old or older.  If you are between the ages of 60 and 72 and are a current or former smoker, ask your health care provider if you should have a one-time screening for abdominal aortic aneurysm (AAA).  Diabetes  Have regular diabetes screenings. This checks your fasting blood sugar level. Have the screening done:  Once every three years after age 66 if you are at a normal weight and have a low risk for diabetes.  More often and at a younger age if you are overweight or have a high risk for diabetes.  What should I know about preventing infection?  Hepatitis B  If you have a higher risk for hepatitis B, you should be screened for this virus. Talk with your health care provider to find out if you are at risk for hepatitis B infection.  Hepatitis C  Blood testing is recommended for:  Everyone born from [birth year] through 1964.  Anyone  with known risk factors for hepatitis C.  Sexually transmitted infections (STIs)  You should be screened each year for STIs, including gonorrhea and chlamydia, if:  You are sexually active and are younger than 62 years of age.  You are older than 62 years of age and your health care provider tells you that you are at risk for this type of infection.  Your sexual activity has changed since you were last screened, and you are at increased risk for chlamydia or gonorrhea. Ask your health care provider if you are at risk.  Ask your health care provider about whether you are at high risk for HIV. Your health care provider  may recommend a prescription medicine to help prevent HIV infection. If you choose to take medicine to prevent HIV, you should first get tested for HIV. You should then be tested every 3 months for as long as you are taking the medicine.  Follow these instructions at home:  Alcohol use  Do not drink alcohol if your health care provider tells you not to drink.  If you drink alcohol:  Limit how much you have to 0-2 drinks a day.  Know how much alcohol is in your drink. In the [place] one drink equals one 12 oz bottle of beer (355 mL), one 5 oz glass of wine (148 mL), or one 1 oz glass of hard liquor (44 mL).  Lifestyle  Do not use any products that contain nicotine or tobacco. These products include cigarettes, chewing tobacco, and vaping devices, such as e-cigarettes. If you need help quitting, ask your health care provider.  Do not use street drugs.  Do not share needles.  Ask your health care provider for help if you need support or information about quitting drugs.  General instructions  Schedule regular health, dental, and eye exams.  Stay current with your vaccines.  Tell your health care provider if:  You often feel depressed.  You have ever been abused or do not feel safe at home.  Summary  Adopting a healthy lifestyle and getting preventive care are important in promoting health and wellness.  Follow your health care provider's instructions about healthy diet, exercising, and getting tested or screened for diseases.  Follow your health care provider's instructions on monitoring your cholesterol and blood pressure.  This information is not intended to replace advice given to you by your health care provider. Make sure you discuss any questions you have with your health care provider.  Document Revised: 10/31/2020 Document Reviewed: 10/31/2020  Elsevier Patient Education  2024 [employer].

## 2024-03-25 NOTE — Assessment & Plan Note (Signed)
 Encourage diet and exercise for weight loss

## 2024-03-25 NOTE — Assessment & Plan Note (Signed)
 Remains elevated despite olmesartan  20 mg daily, will increase to 40 mg daily Reinforced DASH diet and exercise for weight loss

## 2024-03-26 ENCOUNTER — Ambulatory Visit: Payer: Self-pay | Admitting: Internal Medicine

## 2024-03-26 LAB — COMPREHENSIVE METABOLIC PANEL WITH GFR
AG Ratio: 1.3 (calc) (ref 1.0–2.5)
ALT: 13 U/L (ref 9–46)
AST: 13 U/L (ref 10–35)
Albumin: 4 g/dL (ref 3.6–5.1)
Alkaline phosphatase (APISO): 77 U/L (ref 35–144)
BUN: 11 mg/dL (ref 7–25)
CO2: 27 mmol/L (ref 20–32)
Calcium: 9.1 mg/dL (ref 8.6–10.3)
Chloride: 104 mmol/L (ref 98–110)
Creat: 0.9 mg/dL (ref 0.70–1.35)
Globulin: 3 g/dL (ref 1.9–3.7)
Glucose, Bld: 106 mg/dL — ABNORMAL HIGH (ref 65–99)
Potassium: 4.3 mmol/L (ref 3.5–5.3)
Sodium: 139 mmol/L (ref 135–146)
Total Bilirubin: 0.5 mg/dL (ref 0.2–1.2)
Total Protein: 7 g/dL (ref 6.1–8.1)
eGFR: 97 mL/min/1.73m2 (ref >=60)

## 2024-03-26 LAB — LIPID PANEL
Cholesterol: 181 mg/dL (ref <200)
HDL: 43 mg/dL (ref >=40)
LDL Cholesterol (Calc): 126 mg/dL — ABNORMAL HIGH
Non-HDL Cholesterol (Calc): 138 mg/dL — ABNORMAL HIGH (ref <130)
Total CHOL/HDL Ratio: 4.2 (calc) (ref <5.0)
Triglycerides: 41 mg/dL (ref <150)

## 2024-03-26 LAB — CBC
HCT: 45.3 % (ref 38.5–50.0)
Hemoglobin: 15 g/dL (ref 13.2–17.1)
MCH: 30.5 pg (ref 27.0–33.0)
MCHC: 33.1 g/dL (ref 32.0–36.0)
MCV: 92.3 fL (ref 80.0–100.0)
MPV: 11.2 fL (ref 7.5–12.5)
Platelets: 298 Thousand/uL (ref 140–400)
RBC: 4.91 Million/uL (ref 4.20–5.80)
RDW: 12.6 % (ref 11.0–15.0)
WBC: 6 Thousand/uL (ref 3.8–10.8)

## 2024-03-26 LAB — PSA: PSA: 0.97 ng/mL (ref <=4.00)

## 2024-03-26 LAB — HEMOGLOBIN A1C
Hgb A1c MFr Bld: 5.7 % — ABNORMAL HIGH (ref <5.7)
Mean Plasma Glucose: 117 mg/dL
eAG (mmol/L): 6.5 mmol/L

## 2024-03-26 MED ORDER — ATORVASTATIN CALCIUM 20 MG PO TABS: 20.0000 mg | ORAL_TABLET | Freq: Every day | ORAL | 1 refills | Status: AC

## 2024-04-09 ENCOUNTER — Other Ambulatory Visit: Payer: Self-pay | Admitting: Internal Medicine

## 2024-04-10 DIAGNOSIS — M9903 Segmental and somatic dysfunction of lumbar region: Secondary | ICD-10-CM | POA: Diagnosis not present

## 2024-04-10 DIAGNOSIS — M5412 Radiculopathy, cervical region: Secondary | ICD-10-CM | POA: Diagnosis not present

## 2024-04-10 DIAGNOSIS — M9902 Segmental and somatic dysfunction of thoracic region: Secondary | ICD-10-CM | POA: Diagnosis not present

## 2024-04-10 NOTE — Telephone Encounter (Signed)
 D/C 03/26/24. Requested Prescriptions  Refused Prescriptions Disp Refills   atorvastatin  (LIPITOR) 10 MG tablet [Pharmacy Med Name: ATORVASTATIN  10MG  TABLETS] 90 tablet 1    Sig: TAKE 1 TABLET(10 MG) BY MOUTH DAILY     Cardiovascular:  Antilipid - Statins Failed - 04/10/2024  2:14 PM      Failed - Lipid Panel in normal range within the last 12 months    Cholesterol  Date Value Ref Range Status  03/25/2024 181 <200 mg/dL Final   LDL Cholesterol (Calc)  Date Value Ref Range Status  03/25/2024 126 (H) mg/dL (calc) Final    Comment:    Reference range: <100 . Desirable range <100 mg/dL for primary prevention;   <70 mg/dL for patients with CHD or diabetic patients  with > or = 2 CHD risk factors. SABRA LDL-C is now calculated using the Martin-Hopkins  calculation, which is a validated novel method providing  better accuracy than the Friedewald equation in the  estimation of LDL-C.  Gladis APPLETHWAITE et al. SANDREA. 7986;689(80): 2061-2068  (http://education.[facility].com/faq/FAQ164)    HDL  Date Value Ref Range Status  03/25/2024 43 > OR = 40 mg/dL Final   Triglycerides  Date Value Ref Range Status  03/25/2024 41 <150 mg/dL Final         Passed - Patient is not pregnant      Passed - Valid encounter within last 12 months    Recent Outpatient Visits           2 weeks ago Encounter for general adult medical examination with abnormal findings   [facility] [place], Angeline ORN, NP   7 months ago Encounter for Department of Transportation (DOT) examination for driving license renewal   [facility] [place], Angeline ORN, NP   7 months ago Pure hypercholesterolemia   [place] [facility] [place], Angeline ORN, [place]

## 2024-05-20 DIAGNOSIS — M9902 Segmental and somatic dysfunction of thoracic region: Secondary | ICD-10-CM | POA: Diagnosis not present

## 2024-05-20 DIAGNOSIS — M9903 Segmental and somatic dysfunction of lumbar region: Secondary | ICD-10-CM | POA: Diagnosis not present

## 2024-05-20 DIAGNOSIS — M5412 Radiculopathy, cervical region: Secondary | ICD-10-CM | POA: Diagnosis not present

## 2024-05-20 DIAGNOSIS — M9901 Segmental and somatic dysfunction of cervical region: Secondary | ICD-10-CM | POA: Diagnosis not present

## 2024-08-31 ENCOUNTER — Encounter: Admitting: Internal Medicine

## 2024-09-24 ENCOUNTER — Ambulatory Visit: Admitting: Internal Medicine
